# Patient Record
Sex: Female | Born: 1960 | Race: White | Hispanic: No | Marital: Married | Smoking: Never smoker
Health system: Southern US, Community
[De-identification: ages and names within clinical notes are randomized; demographics above are authoritative.]

## PROBLEM LIST (undated history)

## (undated) DIAGNOSIS — F32A Depression, unspecified: Secondary | ICD-10-CM

## (undated) DIAGNOSIS — M2550 Pain in unspecified joint: Secondary | ICD-10-CM

## (undated) DIAGNOSIS — G629 Polyneuropathy, unspecified: Secondary | ICD-10-CM

## (undated) DIAGNOSIS — F419 Anxiety disorder, unspecified: Secondary | ICD-10-CM

## (undated) DIAGNOSIS — K589 Irritable bowel syndrome without diarrhea: Secondary | ICD-10-CM

## (undated) DIAGNOSIS — M5410 Radiculopathy, site unspecified: Secondary | ICD-10-CM

## (undated) DIAGNOSIS — K754 Autoimmune hepatitis: Secondary | ICD-10-CM

## (undated) DIAGNOSIS — K760 Fatty (change of) liver, not elsewhere classified: Secondary | ICD-10-CM

## (undated) DIAGNOSIS — G8929 Other chronic pain: Secondary | ICD-10-CM

## (undated) DIAGNOSIS — G5139 Clonic hemifacial spasm, unspecified: Principal | ICD-10-CM

## (undated) DIAGNOSIS — M797 Fibromyalgia: Secondary | ICD-10-CM

## (undated) DIAGNOSIS — IMO0001 Reserved for inherently not codable concepts without codable children: Secondary | ICD-10-CM

## (undated) DIAGNOSIS — M329 Systemic lupus erythematosus, unspecified: Secondary | ICD-10-CM

## (undated) DIAGNOSIS — L4050 Arthropathic psoriasis, unspecified: Secondary | ICD-10-CM

## (undated) DIAGNOSIS — E7800 Pure hypercholesterolemia, unspecified: Secondary | ICD-10-CM

## (undated) DIAGNOSIS — M549 Dorsalgia, unspecified: Secondary | ICD-10-CM

## (undated) DIAGNOSIS — N390 Urinary tract infection, site not specified: Secondary | ICD-10-CM

## (undated) DIAGNOSIS — I10 Essential (primary) hypertension: Secondary | ICD-10-CM

## (undated) DIAGNOSIS — G4733 Obstructive sleep apnea (adult) (pediatric): Secondary | ICD-10-CM

## (undated) HISTORY — DX: Urinary tract infection, site not specified: N39.0

## (undated) HISTORY — PX: LUMBAR SPINE SURGERY: SHX701

## (undated) HISTORY — DX: Reserved for inherently not codable concepts without codable children: IMO0001

## (undated) HISTORY — DX: Anxiety disorder, unspecified: F41.9

## (undated) HISTORY — DX: Systemic lupus erythematosus, unspecified: M32.9

## (undated) HISTORY — DX: Fatty (change of) liver, not elsewhere classified: K76.0

## (undated) HISTORY — DX: Pure hypercholesterolemia, unspecified: E78.00

## (undated) HISTORY — DX: Essential (primary) hypertension: I10

## (undated) HISTORY — DX: Irritable bowel syndrome, unspecified: K58.9

## (undated) HISTORY — DX: Autoimmune hepatitis: K75.4

## (undated) HISTORY — DX: Other chronic pain: G89.29

## (undated) HISTORY — PX: MANDIBLE SURGERY: SHX707

## (undated) HISTORY — DX: Clonic hemifacial spasm, unspecified: G51.39

## (undated) HISTORY — DX: Radiculopathy, site unspecified: M54.10

## (undated) HISTORY — DX: Pain in unspecified joint: M25.50

## (undated) HISTORY — DX: Polyneuropathy, unspecified: G62.9

## (undated) HISTORY — DX: Dorsalgia, unspecified: M54.9

## (undated) HISTORY — DX: Obstructive sleep apnea (adult) (pediatric): G47.33

## (undated) HISTORY — DX: Fibromyalgia: M79.7

## (undated) HISTORY — DX: Depression, unspecified: F32.A

## (undated) HISTORY — DX: Arthropathic psoriasis, unspecified: L40.50

---

## 1984-02-10 HISTORY — PX: DENTAL SURGERY: SHX609

## 2000-03-11 ENCOUNTER — Encounter: Payer: Self-pay | Admitting: Family Medicine

## 2000-03-11 ENCOUNTER — Encounter: Admission: RE | Admit: 2000-03-11 | Discharge: 2000-03-11 | Payer: Self-pay | Admitting: Family Medicine

## 2001-04-09 ENCOUNTER — Emergency Department (HOSPITAL_COMMUNITY): Admission: EM | Admit: 2001-04-09 | Discharge: 2001-04-09 | Payer: Self-pay | Admitting: Emergency Medicine

## 2001-04-09 ENCOUNTER — Encounter: Payer: Self-pay | Admitting: Emergency Medicine

## 2001-06-23 ENCOUNTER — Other Ambulatory Visit: Admission: RE | Admit: 2001-06-23 | Discharge: 2001-06-23 | Payer: Self-pay | Admitting: Obstetrics and Gynecology

## 2002-08-04 ENCOUNTER — Encounter: Payer: Self-pay | Admitting: Family Medicine

## 2002-08-04 ENCOUNTER — Ambulatory Visit (HOSPITAL_COMMUNITY): Admission: RE | Admit: 2002-08-04 | Discharge: 2002-08-04 | Payer: Self-pay | Admitting: Family Medicine

## 2004-02-10 HISTORY — PX: PARTIAL HYSTERECTOMY: SHX80

## 2004-07-17 ENCOUNTER — Ambulatory Visit (HOSPITAL_COMMUNITY): Admission: RE | Admit: 2004-07-17 | Discharge: 2004-07-17 | Payer: Self-pay | Admitting: Family Medicine

## 2004-07-31 ENCOUNTER — Other Ambulatory Visit: Admission: RE | Admit: 2004-07-31 | Discharge: 2004-07-31 | Payer: Self-pay | Admitting: Obstetrics and Gynecology

## 2004-09-03 ENCOUNTER — Inpatient Hospital Stay (HOSPITAL_COMMUNITY): Admission: RE | Admit: 2004-09-03 | Discharge: 2004-09-04 | Payer: Self-pay | Admitting: Obstetrics and Gynecology

## 2004-09-03 ENCOUNTER — Encounter (INDEPENDENT_AMBULATORY_CARE_PROVIDER_SITE_OTHER): Payer: Self-pay | Admitting: Specialist

## 2005-02-09 HISTORY — PX: CERVICAL SPINE SURGERY: SHX589

## 2005-05-07 ENCOUNTER — Encounter: Admission: RE | Admit: 2005-05-07 | Discharge: 2005-05-07 | Payer: Self-pay

## 2005-06-03 ENCOUNTER — Encounter: Admission: RE | Admit: 2005-06-03 | Discharge: 2005-06-03 | Payer: Self-pay

## 2006-05-26 ENCOUNTER — Ambulatory Visit (HOSPITAL_COMMUNITY): Admission: RE | Admit: 2006-05-26 | Discharge: 2006-05-26 | Payer: Self-pay | Admitting: Family Medicine

## 2006-06-03 ENCOUNTER — Encounter: Admission: RE | Admit: 2006-06-03 | Discharge: 2006-06-03 | Payer: Self-pay | Admitting: Family Medicine

## 2007-01-12 ENCOUNTER — Emergency Department (HOSPITAL_COMMUNITY): Admission: EM | Admit: 2007-01-12 | Discharge: 2007-01-12 | Payer: Self-pay | Admitting: Emergency Medicine

## 2008-02-27 ENCOUNTER — Ambulatory Visit (HOSPITAL_COMMUNITY): Admission: RE | Admit: 2008-02-27 | Discharge: 2008-02-27 | Payer: Self-pay | Admitting: Family Medicine

## 2008-05-14 IMAGING — MG MM DIGITAL SCREENING BILAT
4 series · 4 of 4 positions shown · non-contrast
Comparison: none

DG SCREEN MAMMOGRAM BILATERAL
Bilateral CC and MLO view(s) were taken.

DIGITAL SCREENING MAMMOGRAM WITH CAD:
There is a fibroglandular pattern.  A possible mass is noted in the left breast.  Spot compression 
views and possibly sonography are recommended for further evaluation.  In the right breast, no 
masses or malignant type calcifications are identified.  Compared with prior studies.

[R CC]
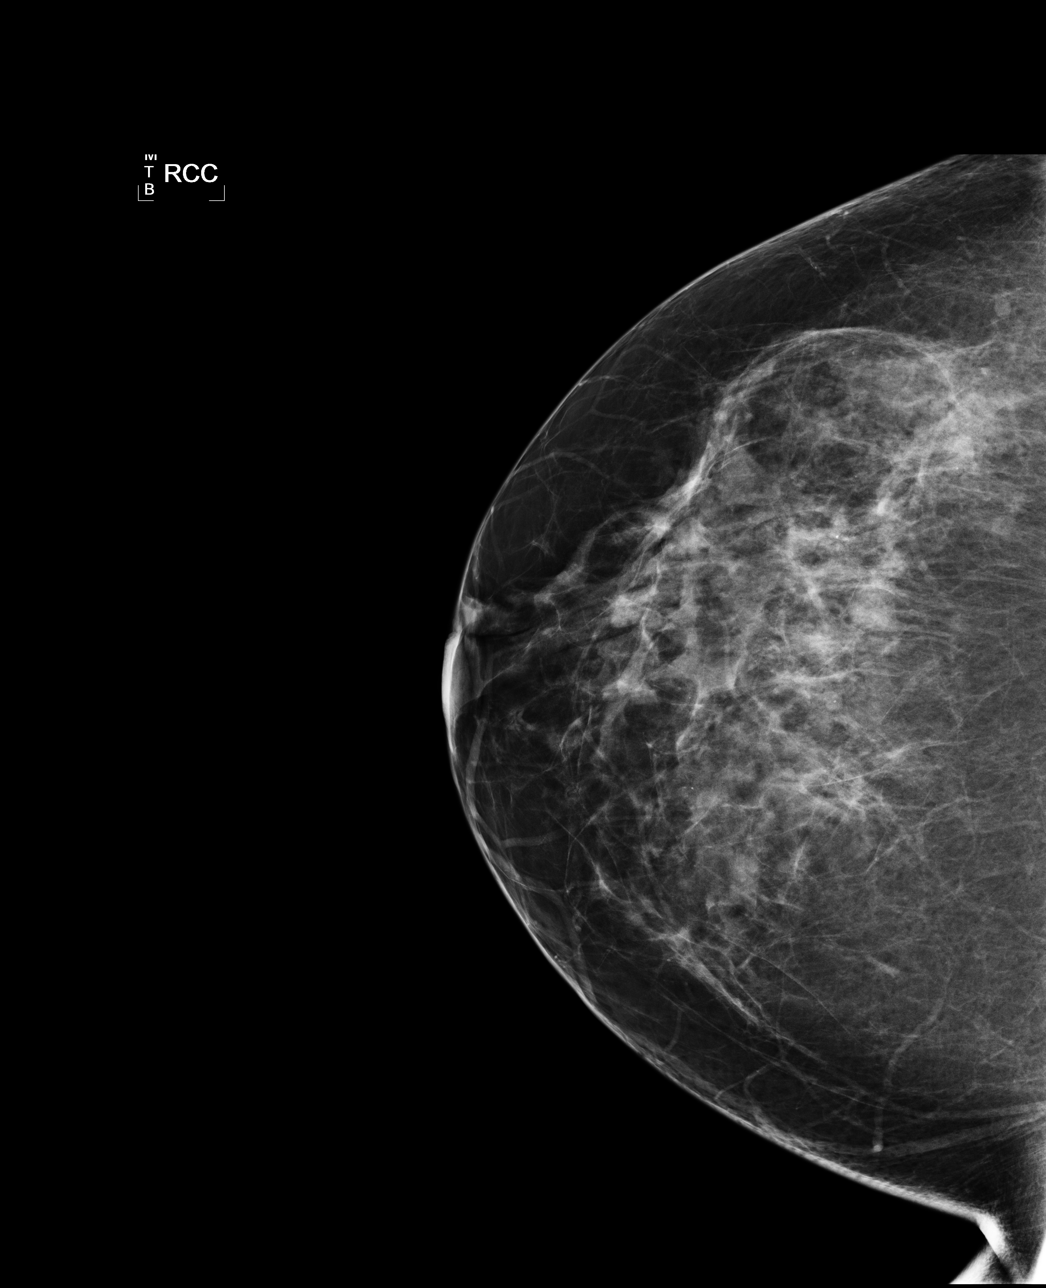

[R MLO]
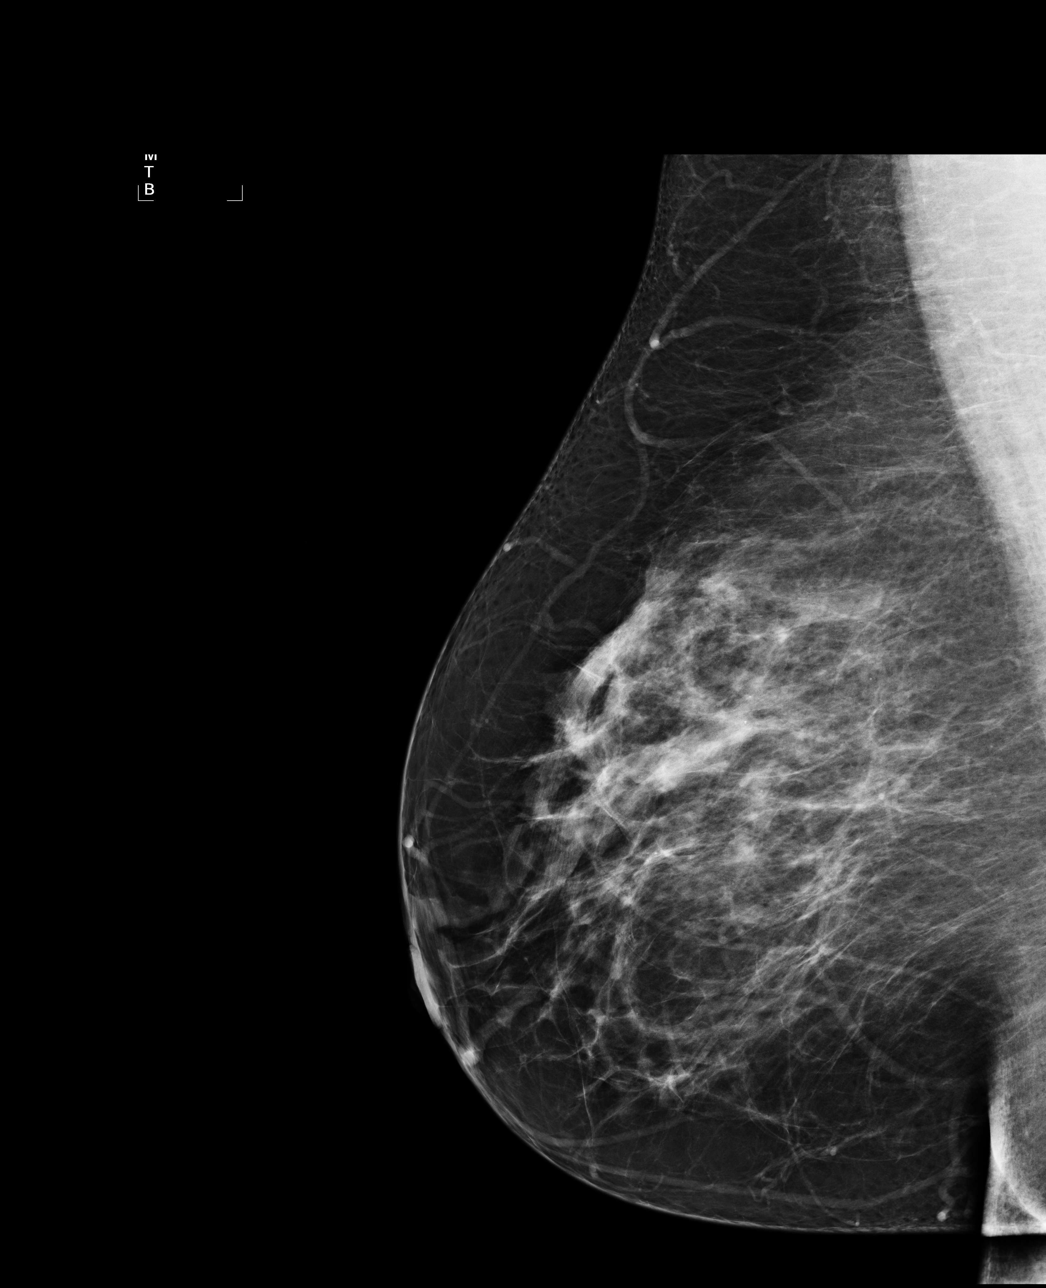

[L CC]
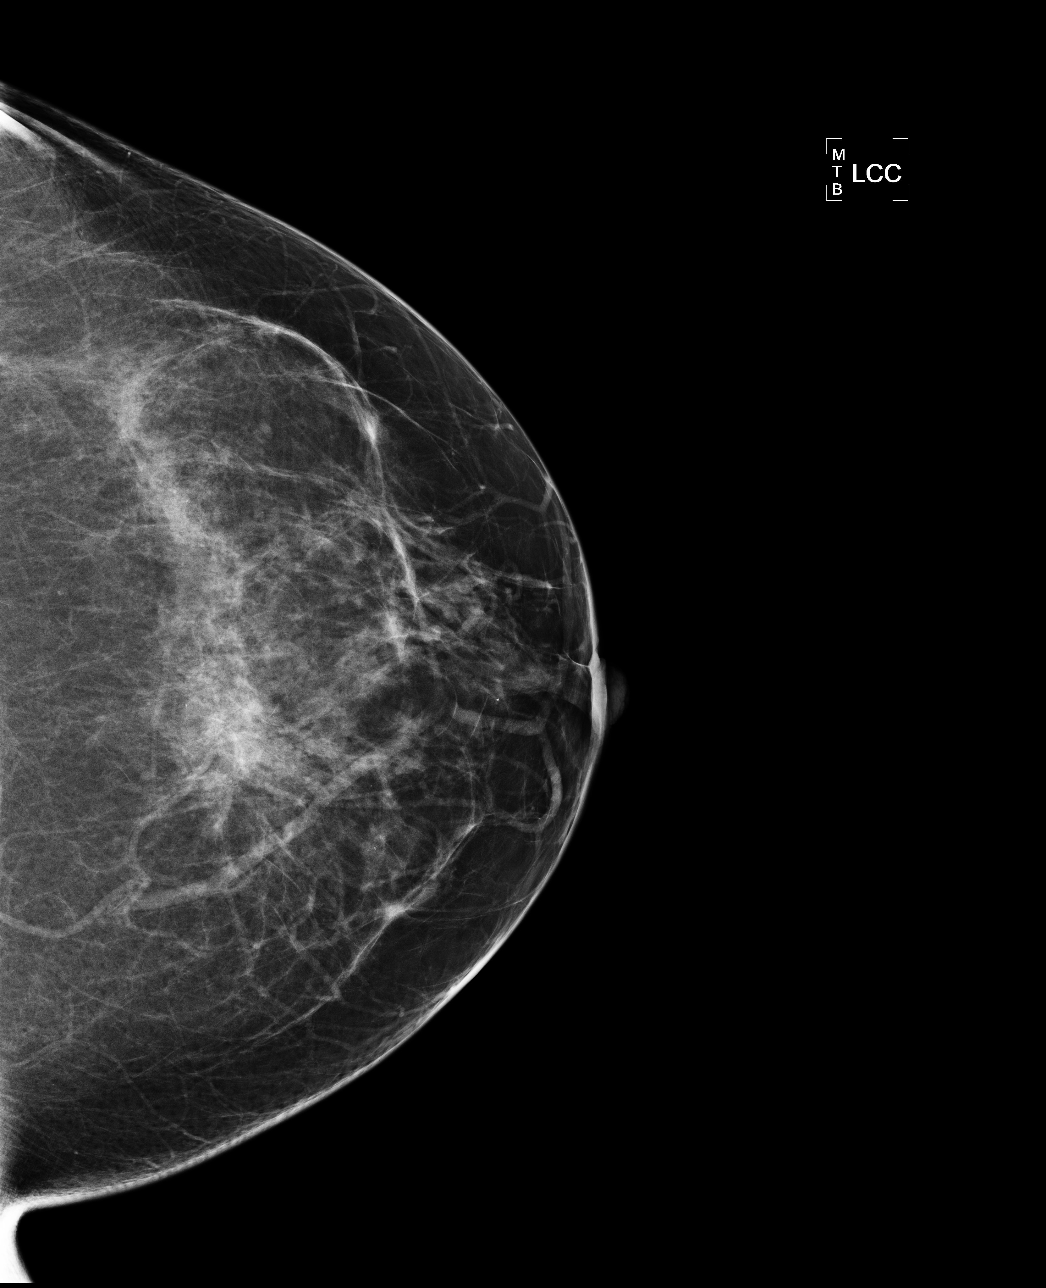

[L MLO]
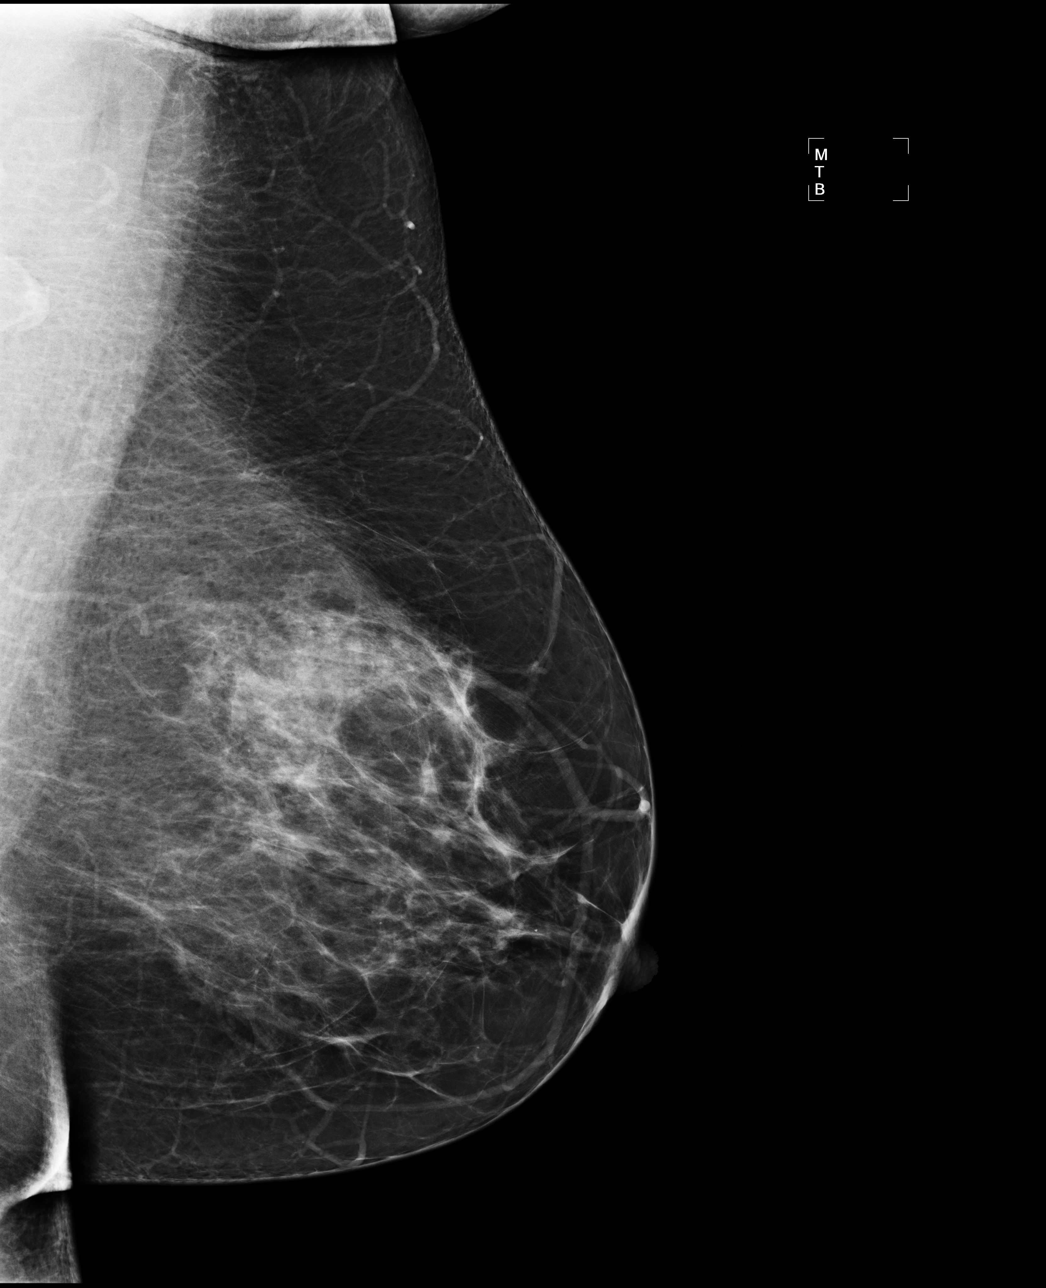

[4 of 4 positions shown; findings below may reference images not displayed]

IMPRESSION: Possible mass, left breast.  Additional evaluation is indicated.  The patient will be contacted for
additional studies and a supplementary report will follow.  No specific mammographic evidence of 
malignancy, right breast.

ASSESSMENT: Need additional imaging evaluation and/or prior mammograms for comparison - BI-RADS 0

Further imaging of the left breast.
ANALYZED BY COMPUTER AIDED DETECTION. , THIS PROCEDURE WAS A DIGITAL MAMMOGRAM.

## 2009-11-07 ENCOUNTER — Ambulatory Visit (HOSPITAL_COMMUNITY): Admission: RE | Admit: 2009-11-07 | Discharge: 2009-11-07 | Payer: Self-pay | Admitting: Family Medicine

## 2010-03-02 ENCOUNTER — Encounter: Payer: Self-pay | Admitting: Family Medicine

## 2010-06-27 NOTE — Op Note (Signed)
[name], [name]                  ACCOUNT NO.:  [id number]   MEDICAL RECORD NO.:  [id number]          PATIENT TYPE:  INP   LOCATION:  9313                          FACILITY:  [facility]   PHYSICIAN:  Randye Lobo, M.D.   DATE OF BIRTH:  [birth year]   DATE OF PROCEDURE:  09/03/2004  DATE OF DISCHARGE:                                 OPERATIVE REPORT   PREOPERATIVE DIAGNOSIS:  Menometrorrhagia   POSTOPERATIVE DIAGNOSIS:  Menometrorrhagia.   PROCEDURE:  Total abdominal hysterectomy.   SURGEON:  Randye Lobo, M.D.   ASSISTANT:  Ilda Mori, M.D.   ANESTHESIA:  Is spinal, epidural.   IV FLUIDS:  2500 mL Ringer's lactate.   ESTIMATED BLOOD LOSS:  300 mL   URINE OUTPUT:  100 mL   COMPLICATIONS:  None.   INDICATIONS FOR PROCEDURE:  The patient is a 50 year old para 70 Caucasian  female with a history of irregular of heavy menses who requests definitive  treatment for her bleeding problems and declines conservative therapy. The  patient has had a pelvic ultrasound which documented the absence of  fibroids. The myometrium having inhomogeneous appearance which was thought  to be consistent with adenomyosis. There was a small left paratubal cyst.  The patient had an endometrial biopsy performed in the office which was  benign and without evidence of hyperplasia or malignancy. The patient's  bleeding caused her to be anemic and she was requiring iron therapy to  maintain a normal red blood cell count. The patient has recently been placed  on disability due to her heavy and irregular bleeding. She has been  intermittently treated with medroxyprogesterone acetate, and she declined  any further conservative treatment of the bleeding and she declines future  childbearing.  A plan was made to proceed with a total abdominal  hysterectomy and possible bilateral salpingo-oophorectomy after risks,  benefits, and alternatives with discussed.   FINDINGS:  Exam under anesthesia revealed an 8  weeks' size, anteverted,  mobile uterus. No adnexal masses were appreciated.   At the time of laparotomy, the patient was noted to have an 8 week size  boggy and generally enlarged uterine fundus. There was a 2 cm left paratubal  cyst. Both of the ovaries were adherent to their respective pelvic  sidewalls. The fallopian tubes along with the ovaries were drawn against the  pelvic sidewalls. The infundibulopelvic ligaments had very engorged  appearing vessels. There was no evidence of any endometriosis in the pelvis.  The appendix had a normal appearance.   SPECIMENS:  The uterine fundus was removed separately from the cervix and  these were sent to pathology.   PROCEDURE:  The patient was reidentified in the preoperative hold area. She  did receive clindamycin 900 milligrams intravenously for antibiotic  prophylaxis. The patient received both TED hose and PAS stockings for DVT  prophylaxis.   In the operating room a spinal epidural anesthetic was performed. The  patient was then placed in the supine position and an examination under  anesthesia was performed. The findings were as noted above. The abdomen and  the  vagina were then sterilely prepped and a Foley catheter was placed  inside the bladder. She was then sterilely draped.   The procedure began by creating a Pfannenstiel incision with the scalpel  along the line of the patient's previous Pfannenstiel incisions. Incision  was carried down to the fascia with sharp dissection with the scalpel.  Hemostasis in the subcutaneous tissue was created with monopolar cautery.  The fascia was then incised in the midline with a scalpel and the incision  was extended bilaterally with a Mayo scissors. The rectus fascia was then  dissected off of the muscles sharply both superiorly and inferiorly. The  parietoperitoneum was elevated with two hemostat clamps and was entered  sharply. The rectus muscles were then sharply divided in the midline  and the  parietal peritoneal incision was extended cranially and caudally using sharp  dissection.   An examination of the pelvis was performed and the findings were as noted  above. A self-retaining retractor was then placed in the pelvis and the  bowel was packed into the upper abdomen using moistened lap pads.   Kelly clamps were then placed across the adnexal structures and the round  ligaments bilaterally. The right round ligament was then suture ligated with  a transfixing suture of 0 Vicryl. The round ligament was then divided with  monopolar cautery and the broad ligament was opened anteriorly and posterior  with a combination of monopolar cautery and sharp dissection with the  Metzenbaum scissors. The same procedure that was performed on the patient's  right-hand side was then repeated on the round ligament of the left-hand  side. Some adhesions between the ovary and the left pelvic sidewall were  then lysed using monopolar cautery and sharp dissection. This allowed  adequate mobility of the adnexal structures in order to clamp the utero-  ovarian ligament with a Heaney clamp. This was then sharply divided and the  pedicle was suture ligated with a transfixing suture of 0 Vicryl followed by  a free tie of 0 Vicryl. The bladder flap was then sharply created. Attention  was then addressed to the right adnexal region. The utero-ovarian ligament  in this region needed to be serially clamped, sharply divided, and suture  ligated with 0 Vicryl in order to divide the adnexa from the uterine fundus.  The uterine arteries were then skeletonized and the uterine arteries were  clamped and sharply divided and suture ligated with 0 Vicryl bilaterally.  The uterine fundus was next amputated from the cervix using a scalpel in  order to improve visualization and working space in the surgical field. The  specimen was set aside. The remainder of the cardinal ligaments were then clamped, sharply  divided with a scalpel, and suture ligated with either  simple suture ligatures of 0 Vicryl or transfixing sutures of 0 Vicryl. The  uterosacral ligaments were then clamped, sharply divided, and suture ligated  with transfixing sutures of 0 Vicryl bilaterally. Sharp dissection of the  bladder flap continued along the anterior cervix as the cervix was noted to  be quite long and thin. Ultimately the vagina was entered sharply, and a  Statinsky scissors were used to circumscribe the cervix from the surrounding  vagina and the cervix was sent to pathology along with the uterine fundus.  Angle sutures of 0 Vicryl were then created bilaterally and the vagina was  closed with a figure-of-eight suture of 0 Vicryl in the midline.   The pelvis was then irrigated and suctioned. An area  of lysis of adhesions  along the left pelvic sidewall and the sigmoid colon was seen to be oozing  slightly and a superficial running suture of  3-0 Vicryl was placed in the  peritoneum in this region. There was also a small area of bleeding along the  right parametrium which responded to a superficial figure-of-eight suture of  2-0 Vicryl. Hemostasis was then excellent.   The adnexal regions were next reassessed. Due to the engorged  infundibulopelvic ligament vessels and the adnexal adhesions which would  limit visualization and removal of the adnexal structures bilaterally, a  decision was made to leave the tubes and ovaries intact. There was some  bleeding noted along the serosa of the left fallopian tube which responded  well to monopolar cautery.   With good hemostasis at all the operative sites, decision was made to close  the abdomen at this time. The parietal peritoneum was closed with a running  suture of 2-0 Vicryl. The rectus muscles were reapproximated in the midline  with interrupted sutures of 0 Vicryl. The fascia was closed with a running  suture of 0 Vicryl after hemostasis of the rectus muscles was  created with  monopolar cautery. The subcutaneous tissue was then irrigated and suctioned  and made hemostatic with monopolar cautery. Staples were then used to close  the skin incision. The patient was cleansed of remaining Betadine and a  sterile pressure bandage was placed over this.   This concluded the patient's surgery. There were no complications to the  procedure. All needle, instrument, sponge counts were correct. The patient  is escorted to the recovery room in stable and awake condition.      Randye Lobo, M.D.  Electronically Signed     BES/MEDQ  D:  09/03/2004  T:  09/03/2004  Job:  [id number]

## 2010-12-04 ENCOUNTER — Other Ambulatory Visit (HOSPITAL_COMMUNITY): Payer: Self-pay | Admitting: Family Medicine

## 2010-12-04 DIAGNOSIS — Z1231 Encounter for screening mammogram for malignant neoplasm of breast: Secondary | ICD-10-CM

## 2011-01-06 ENCOUNTER — Ambulatory Visit (HOSPITAL_COMMUNITY)
Admission: RE | Admit: 2011-01-06 | Discharge: 2011-01-06 | Disposition: A | Discharge status: Home / Self Care | Payer: BC Managed Care – PPO | Source: Ambulatory Visit | Attending: Family Medicine | Admitting: Family Medicine

## 2011-01-06 DIAGNOSIS — Z1231 Encounter for screening mammogram for malignant neoplasm of breast: Secondary | ICD-10-CM | POA: Insufficient documentation

## 2011-01-21 ENCOUNTER — Other Ambulatory Visit: Payer: Self-pay | Admitting: Obstetrics and Gynecology

## 2011-12-24 ENCOUNTER — Other Ambulatory Visit (HOSPITAL_COMMUNITY): Payer: Self-pay | Admitting: Family Medicine

## 2011-12-24 DIAGNOSIS — Z1231 Encounter for screening mammogram for malignant neoplasm of breast: Secondary | ICD-10-CM

## 2012-01-14 ENCOUNTER — Ambulatory Visit (HOSPITAL_COMMUNITY)
Admission: RE | Admit: 2012-01-14 | Discharge: 2012-01-14 | Disposition: A | Discharge status: Home / Self Care | Payer: BC Managed Care – PPO | Source: Ambulatory Visit | Attending: Family Medicine | Admitting: Family Medicine

## 2012-01-14 DIAGNOSIS — Z1231 Encounter for screening mammogram for malignant neoplasm of breast: Secondary | ICD-10-CM | POA: Insufficient documentation

## 2012-08-08 ENCOUNTER — Ambulatory Visit: Payer: BC Managed Care – PPO | Attending: Internal Medicine | Admitting: Physical Therapy

## 2012-08-25 ENCOUNTER — Other Ambulatory Visit: Payer: Self-pay

## 2012-12-14 ENCOUNTER — Ambulatory Visit (INDEPENDENT_AMBULATORY_CARE_PROVIDER_SITE_OTHER): Payer: BC Managed Care – PPO | Admitting: Neurology

## 2012-12-14 ENCOUNTER — Encounter: Payer: Self-pay | Admitting: Neurology

## 2012-12-14 ENCOUNTER — Encounter (INDEPENDENT_AMBULATORY_CARE_PROVIDER_SITE_OTHER): Payer: Self-pay

## 2012-12-14 VITALS — BP 122/77 | HR 68 | Ht 62.0 in | Wt 208.0 lb

## 2012-12-14 DIAGNOSIS — G518 Other disorders of facial nerve: Secondary | ICD-10-CM

## 2012-12-14 DIAGNOSIS — G5139 Clonic hemifacial spasm, unspecified: Secondary | ICD-10-CM | POA: Insufficient documentation

## 2012-12-14 DIAGNOSIS — G4763 Sleep related bruxism: Secondary | ICD-10-CM

## 2012-12-14 DIAGNOSIS — G245 Blepharospasm: Secondary | ICD-10-CM

## 2012-12-14 DIAGNOSIS — M5410 Radiculopathy, site unspecified: Secondary | ICD-10-CM

## 2012-12-14 DIAGNOSIS — G4733 Obstructive sleep apnea (adult) (pediatric): Secondary | ICD-10-CM

## 2012-12-14 DIAGNOSIS — IMO0002 Reserved for concepts with insufficient information to code with codable children: Secondary | ICD-10-CM

## 2012-12-14 HISTORY — DX: Clonic hemifacial spasm, unspecified: G51.39

## 2012-12-14 NOTE — Patient Instructions (Signed)
CPAP and BIPAP Information CPAP and BIPAP are methods of helping you breathe with the use of air pressure. CPAP stands for "continuous positive airway pressure." BIPAP stands for "bi-level positive airway pressure." In both methods, air is blown into your air passages to help keep you breathing well. With CPAP, the amount of pressure stays the same while you breathe in and out. CPAP is most commonly used for obstructive sleep apnea. For obstructive sleep apnea, CPAP works by holding your airways open so that they do not collapse when your muscles relax during sleep. BIPAP is similar to CPAP except the amount of pressure is increased when you inhale. This helps you take larger breaths. Your health care provider will recommend whether CPAP or BIPAP would be more helpful for you.  WHY ARE CPAP AND BIPAP TREATMENTS USED? CPAP or BIPAP can be helpful if you have:   Sleep apnea.   Chronic obstructive pulmonary disease (COPD).   Diseases that weaken the muscles of the chest, including muscular dystrophy or neurological diseases such as amyotrophic lateral sclerosis (ALS).   Other problems that cause breathing to be weak, abnormal, or difficult.  HOW IS CPAP OR BIPAP ADMINISTERED? Both CPAP and BIPAP are provided by a small machine with a flexible plastic tube that attaches to a plastic mask. The mask fits on your face, and air is blown into your air passages through your nose or mouth. The amount of pressure that is used to blow the air into your air passages can be set on the machine. Your health care provider will determine the pressure setting that should be used based on your individual needs.  WHEN SHOULD CPAP OR BIPAP BE USED? In most cases, the mask is worn only when sleeping. Generally, you will need to wear the mask throughout the night and during the daytime if you take a nap. In a few cases involving certain medical conditions, people also need to wear the mask at other times when they are  awake. Follow your health care provider's instructions for when to use the machine.  USING THE MASK  Because the mask needs to be snug, some people feel a trapped or closed-in feeling (claustrophobic) when first using the mask. You may need to get used to the mask gradually. To do this, you can first hold the mask loosely over your nose or mouth. Gradually apply the mask more snugly. You can also gradually increase the amount of time that you use the mask.   Masks are available in various types and sizes. Some fit over your mouth and nose, and some fit over just your nose. If your mask does not fit well, talk to your health care provider about getting a different one.  If you are using a nasal mask and you tend to breathe through your mouth, a chin strap may be applied to help keep your mouth closed.   The CPAP and BIPAP machines have alarms that may sound if the mask comes off or develops a leak.   If you have trouble with the mask, it is very important that you talk to your health care provider about finding a way to make the mask easier to tolerate. Do not stop using the mask. This could have a negative impact on your health. TIPS FOR USING THE MACHINE  Place your CPAP or BIPAP machine on a secure table or stand near an electrical outlet.   Know where the on-off switch is located on the machine.     Follow your health care provider's instructions for how to set the pressure on your machine and when you should use it.   Do not eat or drink while the CPAP or BIPAP machine is on. Food or fluids could get pushed into your lungs by the pressure of the CPAP or BIPAP.  Do not smoke. Tobacco smoke residue can damage the machine.   For home use, CPAP and BIPAP machines can be rented or purchased through home health care companies. Many different brands of machines are available. Renting a machine before purchasing may help you find out which particular machine works well for you. SEEK  IMMEDIATE MEDICAL CARE IF:  You have redness or open areas around your nose or mouth where the mask fits.   You have trouble operating the CPAP or BIPAP machine.   You cannot tolerate wearing the CPAP or BIPAP mask.  Document Released: 10/25/2003 Document Revised: 09/28/2012 Document Reviewed: 08/25/2012 [facility] Patient Information 2014 [place], [place]. Blepharospasm Blepharospasm is an abnormal, involuntary blinking, movement, or spasm of the eyelids. CAUSES  Anyone can have an eye twitch from time to time. In most cases, there is no clear cause. Eyelid spasms may be associated with or prolonged by:   Alcohol.  Caffeine.  Fatigue.  Irritation of the eye surface or inner eyelids.  Lack of sleep.  Physical exertion.  Smoking.  Stress. Chronic, uncontrollable eyelid movement affecting both eyes is known as benign essential blepharospasm. This is not a common problem. Although its exact cause is unknown, the following conditions sometimes come before or during a problem of benign essential blepharospasm:   Eyelid infection (blepharitis).  Dry eyes.  Light sensitivity.  Pink eye (conjunctivitis). Rarely, eye twitch may be a sign of certain brain and nerve disorders. When it is, there are almost always other signs and symptoms. Brain and nerve disorders that can cause eye twitch include:   Bell's palsy.  Benign essential blepharospasm.  Dystonia.  Parkinson's Disease.  Side effects of drugs, particularly medications used to treat epilepsy and psychosis.  Spasmodic torticollis (a different type of muscle spasm sometimes accompanied by blepharospasm).  Tourette syndrome. SYMPTOMS  Random eye twitching can come and go for a few days, weeks or months. The spasms do not hurt, but they can be annoying. In its most common, harmless form, eye twitching stops on its own. It may recur off and on for no reason. The onset of blepharospasm may include:   The development of  blepharospasm without any warning signs.  A gradual increase in blinking or eye irritation.  Other symptoms including:  Fatigue.  Emotional tension.  Sensitivity to bright light. Symptoms may lessen or stop while:  A person is sleeping.  Concentrating on a specific task. If the condition progresses, the symptoms become more frequent. Facial spasms may develop. This is rare and may be the earliest sign of a more chronic movement disorder. TREATMENT  To date, there is no successful cure. Several treatment options can reduce its severity.  In some places, Botox shots into the muscles of the eyelids is an approved treatment. Botulinum toxin temporarily paralyzes the muscles of the eyelids.  Other medications can have unpredictable results. Any symptom relief is usually short term. They tend to be helpful in only a small percentage of cases.  A surgical procedure to remove some of the muscles and nerves of the eyelids (myectomy) is also an option. This surgery has improved symptoms in 75 to 85 percent of people.  Alternative treatments (  the benefits of these alternative therapies have not been proven) may include:  Biofeedback.  Acupuncture.  Hypnosis.  Chiropractic treatment.  Nutritional therapy. FOR MORE INFORMATION [employer]: [email or web address] Document Released: 01/29/2003 Document Revised: 04/20/2011 Document Reviewed: 12/14/2006 [facility] Patient Information 2014 [place], [place].

## 2012-12-14 NOTE — Progress Notes (Signed)
[facility]  Provider:  Melvyn Novas, M D  Referring Provider: Richmond Campbell., PA-C Primary Care Physician:  No primary provider on file.  Chief Complaint  Patient presents with  . Advice Only    LEG WEEKNESS AND NUMBNESS    HPI:  [name] is a 52 y.o. female  Is seen here as a yearly revisit  from  [place], [place]  for CPAP compliance.  The patient was referred today for a 2 problems as well, leg pain and numbness, bilaterally and Blepharospasm. Se did not bring her CPAP to the visit, but showed e her smart phone ap that measured motor activity. Most nights she gets 4-5 hours of rest , according to the activity tracker.  The patient presented on 07-16-2011 her Epworth was 14 points and she had just been pulled by her daughter during a vacation that she stop breathing at night and snored loudly. Polysomnography to place interpreted thousand 13 and was split but CPAP trigger central  apnea is. She was finally responding to BiPAP at 16 cm/ to 10 cm water in combination with heated humidity and a nasal pillows .  The residual AHI on the last download was 5.8 and later 2.8. She is followed by  [facility] .  She had no longer trouble with nocturnal arousals sleep fragmentation,  nocturia or insomnia after starting on BiPAP. Her Epworth sleepiness score was 1 point in June 2013 [name] is been a patient of mine in the past I have followed her for obstructive sleep apnea and BiPAP compliance, next scheduled for a December  visit 214.     She has a past medical history of lupus erythema jaundice, chronic pain and allergies she also had multiple level discectomies and cervical multi level  fusion surgery in the past.  Hypertension, depression and anxiety for also endorsed. She feels sleep is not longer a problem.     She states today that she has had is occasional bouts with blepharospasms the first time when she was in her early twenties, now reported that the  spells happen multiple times a day and sometimes  hinder her for example to drive. She feels that her face is " crunching up " ,  is drawn and the blepharospasm is difficult to overcome.  She also reports that when she is self-conscious or under stress, these blepharospasm  spells were ocurring  more frequently. There is no associated vocalization, she has no urge to  lick her lower lips or clear her throat.  The patient also reports lower extremity pain starting in the hips and running down her legs into the dorsum pedis and between there large and second toe. The Zocor bilaterally. They started in the spring time after thousand 14 after she had been housecleaning she was cleaning a bathtub and seemed to have over stretched something with this activity. The character of the pain is sharp, stabbing. It is no electroshock sensation. He may the pain became suddenly bad,  she had to walk with a cane. Her primary care physician ordered a knee x-ray. She had to miss a whole triathlon- season.  A second bad episode happened in September 2014 - she was impaired in her ability to ambulate and sometimes felt to roll was better to crawl  than to to walk.  The rheumatologist had already ruled out that this was a symptom related to lupus-  he had mentioned fibromyalgia,  Which neither the character nor distribution  of pain  Fit- she does not have baseline myalgia, no trigerpoints.     Review of Systems: Out of a complete 14 system review, the patient complains of only the following symptoms, and all other reviewed systems are negative. As above, joint pain, lower extremity pain in a strip-like distribution, fatigue and weight loss.  History   Social History  . Marital Status: Married    Spouse Name: N/A    Number of Children: 3  . Years of Education: college   Occupational History  .      clinic worker   Social History Main Topics  . Smoking status: Never Smoker   . Smokeless tobacco: Never Used  .  Alcohol Use: No  . Drug Use: No  . Sexual Activity: Not on file   Other Topics Concern  . 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 Diabetes Mother     CHF  . Heart failure Father     HEART ATTACK  . Cancer - Other Maternal Grandmother     Past Medical History  Diagnosis Date  . Hypertension   . Neuropathy   . Joint pain   . Myalgia and myositis     Past Surgical History  Procedure Laterality Date  . Cesarean section  [id number]  . Partial hysterectomy  2006  . Dental surgery  1986    wisdom tooth  . Cervical spine surgery  2007    Current Outpatient Prescriptions  Medication Sig Dispense Refill  . buPROPion (WELLBUTRIN SR) 150 MG 12 hr tablet       . citalopram (CELEXA) 10 MG tablet       . cyclobenzaprine (FLEXERIL) 5 MG tablet       . gabapentin (NEURONTIN) 300 MG capsule       . hydrochlorothiazide (HYDRODIURIL) 25 MG tablet       . HYDROcodone-acetaminophen (NORCO) 10-325 MG per tablet       . hydroxychloroquine (PLAQUENIL) 200 MG tablet       . nitrofurantoin (MACRODANTIN) 50 MG capsule       . predniSONE (DELTASONE) 5 MG tablet       . zolpidem (AMBIEN) 10 MG tablet       . lisinopril (PRINIVIL,ZESTRIL) 10 MG tablet [name] PA , [facility]       No current facility-administered medications for this visit.    Allergies as of 12/14/2012 - Review Complete 12/14/2012  Allergen Reaction Noted  . Fentanyl  12/14/2012  . Penicillins  12/14/2012    Vitals: BP 122/77  Pulse 68  Ht 5\' 2"  (1.575 m)  Wt 208 lb (94.348 kg)  BMI 38.03 kg/m2 Last Weight:  Wt Readings from Last 1 Encounters:  12/14/12 208 lb (94.348 kg)   Last Height:   Ht Readings from Last 1 Encounters:  12/14/12 5\' 2"  (1.575 m)    Physical exam:  General: The patient is awake, alert and appears not in acute distress. The  patient is well groomed. Head: Normocephalic, atraumatic. Neck is supple. Mallampati 2,  Retrognathia, neck circumference:14.5  Cardiovascular:  Regular rate and rhythm , without  murmurs or carotid bruit, and without distended neck veins. Respiratory: Lungs are clear to auscultation. Skin:  Without evidence of edema, or rash Trunk: BMI is elevated and patient  has  normal posture.  Neurologic exam : The patient is awake and alert, oriented to place and time.  Memory subjective  described as intact. There is a normal attention span & concentration ability.  Speech is fluent without dysarthria, dysphonia or aphasia. Mood and affect are appropriate.  Cranial nerves: Pupils are equal and briskly reactive to light. Funduscopic exam without  evidence of pallor or edema.  Extraocular movements  in vertical and horizontal planes intact and without nystagmus. Visual fields by finger perimetry are intact. Hearing to finger rub intact.  Facial sensation intact to fine touch. Facial motor strength is symmetric and tongue and uvula move midline.  Motor exam:   Normal tone and normal muscle bulk and symmetric normal strength in all extremities.  Sensory:  Fine touch, pinprick and vibration were tested in all extremities. Proprioception is normal.  Left foot has more pain, described as tender to the touch , middle of the dorsum pedis.   Coordination: Rapid alternating movements in the fingers/hands is tested and normal. Finger-to-nose maneuver tested and normal without evidence of ataxia, dysmetria or tremor.  Gait and station: Patient walks without assistive device. Strength within normal limits. Stance is stable and normal. Tandem gait is fragmented- she is at higher fall risk. Romberg testing is negative . She can not toe-stand on the left foot.   Deep tendon reflexes: in the  Upper extremities are symmetric and intact. Lower extremities : The left patella reflex was much more brisk than the right.  Babinski maneuver response  Downgoing. The right achilles tendon reflex is absent.    Assessment:  After physical and neurologic examination and pre-existing records, assessment is   1) facial spasms, not only blepharospasms, this is not  Gilles de la Tourettes Syndrome , but a possible tc versus movement disorder. Refer to Dr. Terrace Arabia, Suzzette Righter for movement - possible treatment with  Botox.  2) OSA follow up could not be performed , no machine was provided.  [facility] . 3) Radicular pain L5, S1 - EMG and NCS ordered. MRI lower spine. Dr Ethelene Hal treats her for pain, No [facility] prescriptions other than CPAP supplies were issued.   Plan:  Treatment plan and additional workup : See above .

## 2012-12-16 ENCOUNTER — Encounter: Payer: Self-pay | Admitting: Neurology

## 2012-12-22 NOTE — Telephone Encounter (Signed)
Dear Mrs. Carolin Coy.   In general , patients like to apply their tests to their deductible as to reduce costs. Since the health care insurance plans have varied greatly I am not able to give you an answer as to your plan.  Medically , I would like for you to get the test within 6-8 weeks , early January would be fine.  If your studies count towards this expense or not can be answered by our pre- authorization experts.  Forwarding your request to [facility].   Olie Scaffidi, MD

## 2012-12-23 ENCOUNTER — Telehealth: Payer: Self-pay | Admitting: Neurology

## 2012-12-26 ENCOUNTER — Encounter: Payer: BC Managed Care – PPO | Admitting: Radiology

## 2012-12-26 ENCOUNTER — Encounter: Payer: BC Managed Care – PPO | Admitting: Neurology

## 2012-12-28 ENCOUNTER — Institutional Professional Consult (permissible substitution): Payer: BC Managed Care – PPO | Admitting: Neurology

## 2013-01-03 NOTE — Telephone Encounter (Signed)
Please advise 

## 2013-01-18 ENCOUNTER — Ambulatory Visit: Payer: Self-pay | Admitting: Neurology

## 2013-02-16 ENCOUNTER — Ambulatory Visit (INDEPENDENT_AMBULATORY_CARE_PROVIDER_SITE_OTHER): Payer: BC Managed Care – PPO

## 2013-02-16 DIAGNOSIS — IMO0002 Reserved for concepts with insufficient information to code with codable children: Secondary | ICD-10-CM

## 2013-02-16 DIAGNOSIS — M5410 Radiculopathy, site unspecified: Secondary | ICD-10-CM

## 2013-02-17 ENCOUNTER — Telehealth: Payer: Self-pay | Admitting: Neurology

## 2013-02-17 NOTE — Telephone Encounter (Signed)
I called patient. The MRI of the lumbosacral spine appears to be relatively unremarkable, mild arthritic changes, no evidence of nerve root impingement. I discussed this with the patient. The patient will be having an EMG study soon.  IMPRESSION: Abnormal MRI scan of the lumbar spine showing mild disc and facet degenerative changes from L2-L5 but without significant canal, foraminal stenosis or root impingement.

## 2013-02-22 ENCOUNTER — Telehealth: Payer: Self-pay

## 2013-02-22 NOTE — Telephone Encounter (Signed)
Message copied by [facility], Pearlean Sabina on Wed Feb 22, 2013 10:49 AM ------      Message from: Vickey HugerHMEIER, CARMEN      Created: Mon Feb 20, 2013  1:57 PM       Multilevel degenerative spine disease , but no impingement. Best treatment would be PT and muscle relaxants. ------

## 2013-02-22 NOTE — Telephone Encounter (Signed)
I called patient and we discussed the MRI findings and recommendations. Patient has an EMG/NCV here next week. She will consider the PT and will discuss further next week.

## 2013-02-27 ENCOUNTER — Ambulatory Visit (INDEPENDENT_AMBULATORY_CARE_PROVIDER_SITE_OTHER): Payer: BC Managed Care – PPO | Admitting: Neurology

## 2013-02-27 ENCOUNTER — Encounter (INDEPENDENT_AMBULATORY_CARE_PROVIDER_SITE_OTHER): Payer: Self-pay

## 2013-02-27 ENCOUNTER — Telehealth: Payer: Self-pay | Admitting: Neurology

## 2013-02-27 DIAGNOSIS — M25562 Pain in left knee: Secondary | ICD-10-CM | POA: Insufficient documentation

## 2013-02-27 DIAGNOSIS — M79609 Pain in unspecified limb: Secondary | ICD-10-CM

## 2013-02-27 DIAGNOSIS — M79605 Pain in left leg: Secondary | ICD-10-CM

## 2013-02-27 DIAGNOSIS — M5410 Radiculopathy, site unspecified: Secondary | ICD-10-CM

## 2013-02-27 NOTE — Procedures (Signed)
    [facility]  NCS (NERVE CONDUCTION STUDY) WITH EMG (ELECTROMYOGRAPHY) REPORT   STUDY DATE: 02/27/2013 PATIENT NAME: Pennelope BrackenMary S West DOB: 07/18/1960 MRN: [id number]    TECHNOLOGIST: Gearldine ShownLorraine Jones ELECTROMYOGRAPHER: Levert FeinsteinYan, Nahjae Hoeg M.D.  CLINICAL INFORMATION:  53 years old Caucasian female, presenting with acute onset of left lateral leg achy pain, difficulty walking, since April 2013. On exam: Bilateral lower extremity motor and sensory examination were normal. Deep tendon reflexes are present and symmetric.   FINDINGS: NERVE CONDUCTION STUDY:  Bilateral peroneal sensory responses were normal. Bilateral peroneal to EDB and tibial motor responses were normal  NEEDLE ELECTROMYOGRAPHY:  Selected needle examination was performed at left extremity muscles, and left lumbosacral paraspinal muscles.  Needle examination of left tibialis anterior, tibialis posterior, peroneal longus, medial gastrocnemius, vastus lateralis, gluteus medius were normal.  There is no spontaneous activity at the left lumbosacral paraspinal muscles, left L4 L5, S1  IMPRESSION:  This is a normal study. There is no electrodiagnostic evidence of large fiber peripheral neuropathy, or left lumbosacral radiculopathy.   INTERPRETING PHYSICIAN:   Levert FeinsteinYan, Jakavion Bilodeau M.D. Ph.D. [facility] [address], [address] [place], [place] [address] [phone]

## 2013-02-27 NOTE — Telephone Encounter (Signed)
Please advise 

## 2013-02-27 NOTE — Telephone Encounter (Signed)
HAD NEGATIVE NCV/EMG RESULTS TODAY--WANTS TO KNOW WHAT TO DO NEXT.

## 2013-02-28 NOTE — Telephone Encounter (Signed)
She has pain in her left leg and foot, no weakness, no back pain, no rash and no CN findings. MRi lumbar spine , and EMG and NCS were normal.  Pt ordered for left foot and leg, no dystonia.

## 2013-03-01 NOTE — Telephone Encounter (Signed)
Pt returned call. Please call back.

## 2013-03-01 NOTE — Telephone Encounter (Signed)
Called patient to inform of referral for P-Therapy had been ordered per Dr Vickey Hugerohmeier, left message for return call

## 2013-03-02 NOTE — Telephone Encounter (Signed)
Called patient and informed of PT order sent to [facility], she verbalized understanding

## 2013-03-20 ENCOUNTER — Telehealth: Payer: Self-pay | Admitting: Neurology

## 2013-03-20 NOTE — Telephone Encounter (Signed)
Patient calling concerning referral for physical therapy. Please advise.

## 2013-03-20 NOTE — Telephone Encounter (Signed)
F/U on referral for physical therapy

## 2013-05-18 NOTE — Progress Notes (Signed)
Quick Note:  Patient was called with results of MRI lumbar spine by Dr. Anne HahnWillis in Jan. 2015. ______

## 2014-01-09 ENCOUNTER — Other Ambulatory Visit: Payer: Self-pay

## 2014-03-29 ENCOUNTER — Other Ambulatory Visit (HOSPITAL_COMMUNITY): Payer: Self-pay | Admitting: Family Medicine

## 2014-03-29 DIAGNOSIS — Z1231 Encounter for screening mammogram for malignant neoplasm of breast: Secondary | ICD-10-CM

## 2014-04-03 ENCOUNTER — Ambulatory Visit (HOSPITAL_COMMUNITY): Payer: Self-pay

## 2014-04-12 ENCOUNTER — Ambulatory Visit (HOSPITAL_COMMUNITY): Payer: Self-pay

## 2014-05-03 ENCOUNTER — Ambulatory Visit (HOSPITAL_COMMUNITY)
Admission: RE | Admit: 2014-05-03 | Discharge: 2014-05-03 | Disposition: A | Discharge status: Home / Self Care | Payer: BLUE CROSS/BLUE SHIELD | Source: Ambulatory Visit | Attending: Family Medicine | Admitting: Family Medicine

## 2014-05-03 DIAGNOSIS — Z1231 Encounter for screening mammogram for malignant neoplasm of breast: Secondary | ICD-10-CM | POA: Insufficient documentation

## 2014-07-11 DIAGNOSIS — L4050 Arthropathic psoriasis, unspecified: Secondary | ICD-10-CM | POA: Insufficient documentation

## 2014-07-11 DIAGNOSIS — E785 Hyperlipidemia, unspecified: Secondary | ICD-10-CM | POA: Insufficient documentation

## 2014-07-11 DIAGNOSIS — M791 Myalgia, unspecified site: Secondary | ICD-10-CM | POA: Insufficient documentation

## 2014-07-11 DIAGNOSIS — G4700 Insomnia, unspecified: Secondary | ICD-10-CM | POA: Insufficient documentation

## 2014-07-11 DIAGNOSIS — R29898 Other symptoms and signs involving the musculoskeletal system: Secondary | ICD-10-CM | POA: Insufficient documentation

## 2014-07-11 DIAGNOSIS — I10 Essential (primary) hypertension: Secondary | ICD-10-CM | POA: Insufficient documentation

## 2014-07-11 DIAGNOSIS — M329 Systemic lupus erythematosus, unspecified: Secondary | ICD-10-CM | POA: Insufficient documentation

## 2014-07-11 DIAGNOSIS — F32A Depression, unspecified: Secondary | ICD-10-CM | POA: Insufficient documentation

## 2014-12-11 DIAGNOSIS — H25011 Cortical age-related cataract, right eye: Secondary | ICD-10-CM | POA: Insufficient documentation

## 2014-12-11 HISTORY — DX: Cortical age-related cataract, right eye: H25.011

## 2014-12-25 DIAGNOSIS — H2512 Age-related nuclear cataract, left eye: Secondary | ICD-10-CM

## 2014-12-25 HISTORY — DX: Age-related nuclear cataract, left eye: H25.12

## 2015-10-25 DIAGNOSIS — H5213 Myopia, bilateral: Secondary | ICD-10-CM | POA: Insufficient documentation

## 2015-10-25 DIAGNOSIS — H43811 Vitreous degeneration, right eye: Secondary | ICD-10-CM

## 2015-10-25 HISTORY — DX: Vitreous degeneration, right eye: H43.811

## 2015-11-28 ENCOUNTER — Other Ambulatory Visit: Payer: Self-pay | Admitting: Family Medicine

## 2015-11-28 DIAGNOSIS — Z1231 Encounter for screening mammogram for malignant neoplasm of breast: Secondary | ICD-10-CM

## 2015-12-16 ENCOUNTER — Ambulatory Visit
Admission: RE | Admit: 2015-12-16 | Discharge: 2015-12-16 | Disposition: A | Discharge status: Home / Self Care | Payer: 59 | Source: Ambulatory Visit | Attending: Family Medicine | Admitting: Family Medicine

## 2015-12-16 DIAGNOSIS — Z1231 Encounter for screening mammogram for malignant neoplasm of breast: Secondary | ICD-10-CM

## 2016-03-18 ENCOUNTER — Other Ambulatory Visit: Payer: Self-pay | Admitting: Internal Medicine

## 2016-03-18 DIAGNOSIS — R945 Abnormal results of liver function studies: Principal | ICD-10-CM

## 2016-03-18 DIAGNOSIS — R7989 Other specified abnormal findings of blood chemistry: Secondary | ICD-10-CM

## 2016-03-23 ENCOUNTER — Ambulatory Visit
Admission: RE | Admit: 2016-03-23 | Discharge: 2016-03-23 | Disposition: A | Discharge status: Home / Self Care | Payer: 59 | Source: Ambulatory Visit | Attending: Internal Medicine | Admitting: Internal Medicine

## 2016-03-23 DIAGNOSIS — R945 Abnormal results of liver function studies: Principal | ICD-10-CM

## 2016-03-23 DIAGNOSIS — R7989 Other specified abnormal findings of blood chemistry: Secondary | ICD-10-CM

## 2016-03-25 ENCOUNTER — Other Ambulatory Visit (HOSPITAL_COMMUNITY): Payer: Self-pay | Admitting: Gastroenterology

## 2016-03-25 DIAGNOSIS — R748 Abnormal levels of other serum enzymes: Secondary | ICD-10-CM

## 2016-04-01 ENCOUNTER — Other Ambulatory Visit: Payer: Self-pay | Admitting: Radiology

## 2016-04-01 ENCOUNTER — Other Ambulatory Visit: Payer: Self-pay | Admitting: Physician Assistant

## 2016-04-02 ENCOUNTER — Encounter (HOSPITAL_COMMUNITY): Payer: Self-pay

## 2016-04-02 ENCOUNTER — Ambulatory Visit (HOSPITAL_COMMUNITY)
Admission: RE | Admit: 2016-04-02 | Discharge: 2016-04-02 | Disposition: A | Discharge status: Home / Self Care | Payer: 59 | Source: Ambulatory Visit | Attending: Gastroenterology | Admitting: Gastroenterology

## 2016-04-02 DIAGNOSIS — I10 Essential (primary) hypertension: Secondary | ICD-10-CM | POA: Insufficient documentation

## 2016-04-02 DIAGNOSIS — Z8249 Family history of ischemic heart disease and other diseases of the circulatory system: Secondary | ICD-10-CM | POA: Diagnosis not present

## 2016-04-02 DIAGNOSIS — M329 Systemic lupus erythematosus, unspecified: Secondary | ICD-10-CM | POA: Diagnosis not present

## 2016-04-02 DIAGNOSIS — R768 Other specified abnormal immunological findings in serum: Secondary | ICD-10-CM | POA: Insufficient documentation

## 2016-04-02 DIAGNOSIS — R748 Abnormal levels of other serum enzymes: Secondary | ICD-10-CM | POA: Diagnosis present

## 2016-04-02 DIAGNOSIS — Z79899 Other long term (current) drug therapy: Secondary | ICD-10-CM | POA: Diagnosis not present

## 2016-04-02 DIAGNOSIS — G629 Polyneuropathy, unspecified: Secondary | ICD-10-CM | POA: Insufficient documentation

## 2016-04-02 DIAGNOSIS — K759 Inflammatory liver disease, unspecified: Secondary | ICD-10-CM | POA: Diagnosis not present

## 2016-04-02 DIAGNOSIS — Z7982 Long term (current) use of aspirin: Secondary | ICD-10-CM | POA: Insufficient documentation

## 2016-04-02 DIAGNOSIS — Z833 Family history of diabetes mellitus: Secondary | ICD-10-CM | POA: Diagnosis not present

## 2016-04-02 DIAGNOSIS — Z880 Allergy status to penicillin: Secondary | ICD-10-CM | POA: Diagnosis not present

## 2016-04-02 LAB — APTT: aPTT: 34 seconds (ref 24–36)

## 2016-04-02 LAB — CBC
HCT: 40.2 % (ref 36.0–46.0)
Hemoglobin: 13.4 g/dL (ref 12.0–15.0)
MCH: 30.9 pg (ref 26.0–34.0)
MCHC: 33.3 g/dL (ref 30.0–36.0)
MCV: 92.6 fL (ref 78.0–100.0)
PLATELETS: 252 10*3/uL (ref 150–400)
RBC: 4.34 MIL/uL (ref 3.87–5.11)
RDW: 13.8 % (ref 11.5–15.5)
WBC: 5.4 10*3/uL (ref 4.0–10.5)

## 2016-04-02 LAB — PROTIME-INR
INR: 1.04
PROTHROMBIN TIME: 13.6 s (ref 11.4–15.2)

## 2016-04-02 MED ORDER — GELATIN ABSORBABLE 12-7 MM EX MISC
CUTANEOUS | Status: AC
  Filled 2016-04-02: qty 1

## 2016-04-02 MED ORDER — LIDOCAINE HCL (PF) 1 % IJ SOLN
INTRAMUSCULAR | Status: AC
  Filled 2016-04-02: qty 10

## 2016-04-02 MED ORDER — MIDAZOLAM HCL 2 MG/2ML IJ SOLN
INTRAMUSCULAR | Status: AC | PRN
  Administered 2016-04-02 (×2): 1 mg via INTRAVENOUS

## 2016-04-02 MED ORDER — SODIUM CHLORIDE 0.9 % IV SOLN: INTRAVENOUS | Status: DC

## 2016-04-02 MED ORDER — MIDAZOLAM HCL 2 MG/2ML IJ SOLN
INTRAMUSCULAR | Status: AC
  Filled 2016-04-02: qty 4

## 2016-04-02 MED ORDER — SODIUM CHLORIDE 0.9 % IV SOLN
INTRAVENOUS | Status: AC | PRN
  Administered 2016-04-02: 10 mL/h via INTRAVENOUS

## 2016-04-02 NOTE — Sedation Documentation (Signed)
Called to give report. Nurse unavailable. Will call back 

## 2016-04-02 NOTE — H&P (Signed)
Chief Complaint: Patient was seen in consultation today for random liver biopsy at the request of Schooler,Vincent  Referring Physician(s): Schooler,Vincent  Supervising Physician: Ruel Favors  Patient Status: [facility] - Out-pt  History of Present Illness: [name] is a 56 y.o. female   Dx Lupus over 10 yrs ago Was just treated with Methotrexate x 1.5 yrs  Noted new increase in Liver function tests  [place] 2/12: IMPRESSION: 1.  Multiple gallstones. 2. Liver has a heterogeneous echotexture suggesting fatty infiltration and/or hepatocellular disease.  Dr Bosie Clos has requested liver core biopsy    Past Medical History:  Diagnosis Date  . 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 Hypertension   . Joint pain   . Myalgia and myositis   . Neuropathy (HCC)   . Radicular pain of left lower extremity     Past Surgical History:  Procedure Laterality Date  . CERVICAL SPINE SURGERY  2007  . CESAREAN SECTION  [phone]  . DENTAL SURGERY  1986   wisdom tooth  . PARTIAL HYSTERECTOMY  2006    Allergies: Fentanyl and Penicillins  Medications: Prior to Admission medications   Medication Sig Start Date End Date Taking? Authorizing Provider  alclomethasone (ACLOVATE) 0.05 % cream Apply 1 application topically 2 (two) times daily as needed (Use on face).   Yes Historical Provider, MD  aspirin EC 81 MG tablet Take 81 mg by mouth daily.   Yes Historical Provider, MD  buPROPion (WELLBUTRIN XL) 300 MG 24 hr tablet Take 300 mg by mouth at bedtime.   Yes Historical Provider, MD  cephALEXin (KEFLEX) 500 MG capsule Take 500 mg by mouth daily. Ongoing treatment for prevention of UTIs   Yes Historical Provider, MD  cetirizine (ZYRTEC) 10 MG tablet Take 10 mg by mouth daily.   Yes Historical Provider, MD  cholecalciferol (VITAMIN D) 1000 units tablet Take 1,000 Units by mouth daily.   Yes Historical Provider, MD  citalopram (CELEXA) 20 MG tablet Take 20 mg by mouth daily.   Yes Historical Provider,  MD  docusate sodium (COLACE) 100 MG capsule Take 100 mg by mouth daily.   Yes Historical Provider, MD  folic acid (FOLVITE) 1 MG tablet Take 1 mg by mouth daily.   Yes Historical Provider, MD  gabapentin (NEURONTIN) 300 MG capsule Take 300 mg by mouth 4 (four) times daily.  11/11/12  Yes Historical Provider, MD  hydrochlorothiazide (HYDRODIURIL) 25 MG tablet Take 25 mg by mouth every morning.  11/12/12  Yes Historical Provider, MD  hydroxychloroquine (PLAQUENIL) 200 MG tablet Take 200 mg by mouth 2 (two) times daily.   Yes Historical Provider, MD  lisinopril (PRINIVIL,ZESTRIL) 10 MG tablet Take 10 mg by mouth every morning. [name] PA , [facility]  12/07/12  Yes Historical Provider, MD  lovastatin (MEVACOR) 10 MG tablet Take 10 mg by mouth every morning.   Yes Historical Provider, MD  Oxycodone HCl 10 MG TABS Take 10 mg by mouth 4 (four) times daily.   Yes Historical Provider, MD  predniSONE (DELTASONE) 5 MG tablet Take 5 mg by mouth daily.  11/16/12  Yes Historical Provider, MD  Turmeric 500 MG CAPS Take 1 capsule by mouth daily.   Yes Historical Provider, MD  zolpidem (AMBIEN) 10 MG tablet Take 10 mg by mouth at bedtime.  09/26/12  Yes Historical Provider, MD  betamethasone dipropionate 0.05 % lotion Apply 1 application topically 2 (two) times daily as needed (Use on scalp and ears).    Historical Provider, MD  triamcinolone cream (KENALOG) 0.1 % Apply 1 application topically 2 (two) times daily as needed (arms and legs).    Historical Provider, MD     Family History  Problem Relation Age of Onset  . Diabetes Mother     CHF  . Heart failure Father     HEART ATTACK  . Cancer - Other Maternal Grandmother     Social History   Social History  . Marital status: Married    Spouse name: N/A  . Number of children: 3  . Years of education: college   Occupational History  .  [employer]    clinic worker   Social History Main Topics  . Smoking status: Never Smoker  . Smokeless  tobacco: Never Used  . Alcohol use No  . Drug use: No  . Sexual activity: Not Asked   Other Topics Concern  . 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  All other systems are negative.  Review of Systems  Constitutional: Negative for activity change, appetite change, fatigue and fever.  Respiratory: Negative for shortness of breath.   Cardiovascular: Negative for chest pain.  Gastrointestinal: Negative for abdominal pain.  Neurological: Negative for weakness.  Psychiatric/Behavioral: Negative for behavioral problems and confusion.    Vital Signs: BP 132/79   Pulse 75   Temp 98.4 F (36.9 C) (Oral)   Ht 5' 1.5" (1.562 m)   Wt 208 lb (94.3 kg)   SpO2 98%   BMI 38.66 kg/m   Physical Exam  Constitutional: She is oriented to person, place, and time. She appears well-nourished.  Cardiovascular: Normal rate, regular rhythm and normal heart sounds.   Pulmonary/Chest: Effort normal and breath sounds normal. She has no wheezes.  Abdominal: Soft. Bowel sounds are normal. There is no tenderness.  Musculoskeletal: Normal range of motion.  Neurological: She is alert and oriented to person, place, and time.  Skin: Skin is warm and dry.  Psychiatric: She has a normal mood and affect. Her behavior is normal. Judgment and thought content normal.  Nursing note and vitals reviewed.   Mallampati Score:  MD Evaluation Airway: WNL Heart: WNL Abdomen: WNL Chest/ Lungs: WNL ASA  Classification: 2 Mallampati/Airway Score: One  Imaging: US Abdomen Limited Ruq  Result Date: 03/23/2016 CLINICAL DATA:  Elevated LFTs. EXAM: US ABDOMEN LIMITED - RIGHT UPPER QUADRANT COMPARISON:  CT 02/02/2011. FINDINGS: Gallbladder: Multiple gallstones measuring up to 1.3 cm. Gallbladder wall thickness  normal. Negative Murphy sign . Common bile duct: Diameter: 3.2 mm. Liver: Liver has a heterogeneous echotexture suggesting fatty infiltration and/or hepatocellular disease. No focal hepatic abnormality identified. IMPRESSION: 1.  Multiple gallstones. 2. Liver has a heterogeneous echotexture suggesting fatty infiltration and/or hepatocellular disease. Electronically Signed   By: Maisie Fus  Register   On: 03/23/2016 12:02    Labs:  CBC:  Recent Labs  04/02/16 1215  WBC 5.4  HGB 13.4  HCT 40.2  PLT 252    COAGS:  Recent Labs  04/02/16 1215  INR 1.04  APTT 34    BMP: No results for input(s): NA, K, CL, CO2, GLUCOSE, BUN, CALCIUM, CREATININE, GFRNONAA, GFRAA in the last 8760 hours.  Invalid input(s): CMP  LIVER FUNCTION TESTS: No results for input(s): BILITOT, AST, ALT, ALKPHOS, PROT, ALBUMIN in  the last 8760 hours.  TUMOR MARKERS: No results for input(s): AFPTM, CEA, CA199, CHROMGRNA in the last 8760 hours.  Assessment and Plan:  Elevated liver function tests Now scheduled for liver core bx Risks and Benefits discussed with the patient including, but not limited to bleeding, infection, damage to adjacent structures or low yield requiring additional tests. All of the patient's questions were answered, patient is agreeable to proceed. Consent signed and in chart.   Thank you for this interesting consult.  I greatly enjoyed meeting [name] and look forward to participating in their care.  A copy of this report was sent to the requesting provider on this date.  Electronically Signed: Ralene MuskratURPIN,Katisha Shimizu A 04/02/2016, 1:00 PM   I spent a total of  30 Minutes   in face to face in clinical consultation, greater than 50% of which was counseling/coordinating care for liver core bx

## 2016-04-02 NOTE — Discharge Instructions (Signed)
Liver Biopsy, Care After °Introduction °These instructions give you information on caring for yourself after your procedure. Your doctor may also give you more specific instructions. Call your doctor if you have any problems or questions after your procedure. °Follow these instructions at home: °· Rest at home for 1-2 days or as told by your doctor. °· Have someone stay with you for at least 24 hours. °· Do not do these things in the first 24 hours: °¨ Drive. °¨ Use machinery. °¨ Take care of other people. °¨ Sign legal documents. °¨ Take a bath or shower. °· There are many different ways to close and cover a cut (incision). For example, a cut can be closed with stitches, skin glue, or adhesive strips. Follow your doctor's instructions on: °¨ Taking care of your cut. °¨ Changing and removing your bandage (dressing). °¨ Removing whatever was used to close your cut. °· Do not drink alcohol in the first week. °· Do not lift more than 5 pounds or play contact sports for the first 2 weeks. °· Take medicines only as told by your doctor. For 1 week, do not take medicine that has aspirin in it or medicines like ibuprofen. °· Get your test results. °Contact a doctor if: °· A cut bleeds and leaves more than just a small spot of blood. °· A cut is red, puffs up (swells), or hurts more than before. °· Fluid or something else comes from a cut. °· A cut smells bad. °· You have a fever or chills. °Get help right away if: °· You have swelling, bloating, or pain in your belly (abdomen). °· You get dizzy or faint. °· You have a rash. °· You feel sick to your stomach (nauseous) or throw up (vomit). °· You have trouble breathing, feel short of breath, or feel faint. °· Your chest hurts. °· You have problems talking or seeing. °· You have trouble balancing or moving your arms or legs. °This information is not intended to replace advice given to you by your health care provider. Make sure you discuss any questions you have with your  health care provider. °Document Released: 11/05/2007 Document Revised: 07/04/2015 Document Reviewed: 03/24/2013 °© 2017 Elsevier ° °

## 2016-04-02 NOTE — Procedures (Signed)
Abnormal LFTs, possible autoimmune hepatitis  US RANDOM LIVER CORE BX  No comp Stable Path pending Full report in PACS

## 2016-08-10 ENCOUNTER — Encounter: Payer: Self-pay | Admitting: Neurology

## 2016-09-24 ENCOUNTER — Encounter: Payer: Self-pay | Admitting: Neurology

## 2016-09-28 ENCOUNTER — Encounter: Payer: Self-pay | Admitting: Neurology

## 2016-09-28 ENCOUNTER — Ambulatory Visit (INDEPENDENT_AMBULATORY_CARE_PROVIDER_SITE_OTHER): Payer: 59 | Admitting: Neurology

## 2016-09-28 VITALS — BP 116/79 | HR 69 | Ht 61.0 in | Wt 215.0 lb

## 2016-09-28 DIAGNOSIS — Z789 Other specified health status: Secondary | ICD-10-CM

## 2016-09-28 DIAGNOSIS — G4739 Other sleep apnea: Secondary | ICD-10-CM | POA: Insufficient documentation

## 2016-09-28 DIAGNOSIS — G4731 Primary central sleep apnea: Secondary | ICD-10-CM | POA: Diagnosis not present

## 2016-09-28 HISTORY — DX: Other sleep apnea: G47.39

## 2016-09-28 NOTE — Progress Notes (Signed)
SLEEP MEDICINE CLINIC   Provider:  Melvyn Novas, [place]  Primary Care Physician:  Richmond Campbell., PA-C   Referring Provider: Richmond Campbell., PA-C    Chief Complaint  Patient presents with  . New Patient (Initial Visit)    new patient, had a sleep study at least 5 years ago and pt was placed on cpap at that time. pt still currently uses her machine but states that it doesnt work well. interested in the new machines and masks    HPI:  [name] is a 56 y.o. female , seen here as in a referral/ revisit  from PA. Arlyce Dice for a re- evaluation of apnea , new sleep study with the goal of a new BiPAP machine.   [name] received her biPAP through our clinic in 2013 around [place], after she was diagnosed with apnea but did not respond well to CPAP therapy. Since CPAP therapy failed her she switched to BiPAP is much better results and she continues to use CPAP now compliantly. I called from her last compliance reports dated 08-10-2016, the patient's average user time was 5 hours and 59 minutes, she used to machine 25 out of 30 days, she is using a VPAP there is timed breath, this function is called ST, set at 16 cm inspiratory pressure, 10 cm expiratory pressure and a respiratory rate of 14 bpm, her residual AHI is 6.5 currently with some air leaks she needs reportedly a new machine because it switches to different pressures, and makes noises.   Sleep habits are as follows: The patient goes to bed around 10:15 but reads in bed for about an hour. She will then fall asleep, usually her CPAP is in place, she prefers to sleep on her side. She's using one pillow for head support. Her bed is flat, the bedroom is cool, quiet and dark. She shares a bedroom with her husband who has also commented on the noises that the machine makes. She does not report any nocturia, nocturnal palpitations, diaphoresis, nausea or headaches. She wakes in the morning at 6 AM usually before her alarm rings. When she  rises at that time she has slept for about 6.5 hours.  She does not longer feel refreshed and restored however. She used to have better sleep quality. She has a longstanding problem with CPAP compliance. Essentially , she doesn't like CPAP.    Sleep medical history and family sleep history: diagnosed with complex  OSA 2013, treatment emerging central apneas. Her daughter has Apnea, too.  Two Maternal uncles have OSA . The patient also carries a diagnosis of hypertension, depression, hyperlipidemia, insomnia, obesity, systemic lupus erythematosus, estrogen deficiency. Recent  BMI of 42.14   Social history:  Married, 3 daughters, all in their 27 's, caffeine : 1-2 cups in AM, Tobacco, never, ETOH, never.  1 grandson.  Shift work history : none.    Note from 3.5 years ago : CPAP clinic NURIYAH HANLINE is a 56 y.o. female  Is seen here as a yearly revisit  from  Kathee [place], [place]  for CPAP compliance.  The patient was referred today for a 2 problems as well, leg pain and numbness, bilaterally and Blepharospasm. Se did not bring her CPAP to the visit, but showed e her smart phone ap that measured motor activity. Most nights she gets 4-5 hours of rest , according to the activity tracker.   The patient presented on 07-16-2011 her Epworth was 14 points and she  had just been pulled by her daughter during a vacation that she stop breathing at night and snored loudly. Polysomnography to place interpreted thousand 13 and was split but CPAP trigger central  apnea is. She was finally responding to BiPAP at 16 cm/ to 10 cm water in combination with heated humidity and a nasal pillows .  The residual AHI on the last download was 5.8 and later 2.8. She is followed by  DME [facility] .  She had no longer trouble with nocturnal arousals sleep fragmentation,  nocturia or insomnia after starting on BiPAP. Her Epworth sleepiness score was 1 point in June 2013 [name] is been a patient of mine in the past I have followed her  for obstructive sleep apnea and BiPAP compliance, next scheduled for a December  visit 2014.     Review of Systems: Out of a complete 14 system review, the patient complains of only the following symptoms, and all other reviewed systems are negative.  [name] endorsed joint pain, aching muscles, respiratory allergies, snoring, fatigue insomnia and sleepiness the feeling of not getting enough sleep of confusion, depression and anxiety. She status post hysterectomy 2006 and cervical discectomy 2007 her Epworth sleepiness score was endorsed at 6 points, her fatigue severity at 63 points. The letter has never responded to positive airway pressure therapy at it seems that the patient suffered mainly from fatigue in context with an autoimmune disease.  Depression score 3/15   Social History   Social History  . Marital status: Married    Spouse name: N/A  . Number of children: 3  . Years of education: college   Occupational History  .  [employer]    clinic worker   Social History Main Topics  . Smoking status: Never Smoker  . Smokeless tobacco: Never Used  . Alcohol use No  . Drug use: No  . Sexual activity: Not on file   Other Topics Concern  . 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 Diabetes Mother        CHF  . Heart failure Father        HEART ATTACK  . Cancer - Other Maternal Grandmother     Past Medical History:  Diagnosis Date  . 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 Hypertension   . Joint pain   . Myalgia and myositis   . Neuropathy   . Radicular pain of left lower extremity     Past Surgical History:  Procedure Laterality Date  . CERVICAL SPINE SURGERY  2007  . CESAREAN SECTION  [id number]  . DENTAL SURGERY  1986   wisdom tooth  . PARTIAL HYSTERECTOMY  2006    Current Outpatient Prescriptions    Medication Sig Dispense Refill  . azaTHIOprine (IMURAN) 50 MG tablet Take 50 mg by mouth daily.    Marland Kitchen conjugated estrogens (PREMARIN) vaginal cream Place 1 Applicatorful vaginally every 3 (three) days.    Marland Kitchen alclomethasone (ACLOVATE) 0.05 % cream Apply 1 application topically 2 (two) times daily as needed (Use on face).    . betamethasone dipropionate 0.05 % lotion Apply 1 application topically 2 (two) times daily as needed (Use on scalp and ears).    Marland Kitchen buPROPion (WELLBUTRIN XL) 300 MG 24 hr tablet Take 300 mg by mouth at  bedtime.    . cetirizine (ZYRTEC) 10 MG tablet Take 10 mg by mouth daily.    . cholecalciferol (VITAMIN D) 1000 units tablet Take 1,000 Units by mouth daily.    . citalopram (CELEXA) 20 MG tablet Take 20 mg by mouth daily.    Marland Kitchen gabapentin (NEURONTIN) 300 MG capsule Take 300 mg by mouth 4 (four) times daily.     . hydrochlorothiazide (HYDRODIURIL) 25 MG tablet Take 25 mg by mouth every morning.     . hydroxychloroquine (PLAQUENIL) 200 MG tablet Take 200 mg by mouth 2 (two) times daily.    Marland Kitchen lisinopril (PRINIVIL,ZESTRIL) 10 MG tablet Take 10 mg by mouth every morning. [name] PA , [facility]     . lovastatin (MEVACOR) 10 MG tablet Take 10 mg by mouth every morning.    . Oxycodone HCl 10 MG TABS Take 10 mg by mouth 4 (four) times daily.    . predniSONE (DELTASONE) 5 MG tablet Take 5 mg by mouth daily.     Marland Kitchen triamcinolone cream (KENALOG) 0.1 % Apply 1 application topically 2 (two) times daily as needed (arms and legs).    . zolpidem (AMBIEN) 10 MG tablet Take 10 mg by mouth at bedtime.      No current facility-administered medications for this visit.     Allergies as of 09/28/2016 - Review Complete 04/02/2016  Allergen Reaction Noted  . Fentanyl Nausea And Vomiting 12/14/2012  . Penicillins Other (See Comments) 12/14/2012    Vitals: BP 116/79   Pulse 69   Ht 5\' 1"  (1.549 m)   Wt 215 lb (97.5 kg)   BMI 40.62 kg/m  Last Weight:  Wt Readings from Last 1  Encounters:  09/28/16 215 lb (97.5 kg)   JOA:CZYS mass index is 40.62 kg/m.     Last Height:   Ht Readings from Last 1 Encounters:  09/28/16 5\' 1"  (1.549 m)    Physical exam:  General: The patient is awake, alert and appears not in acute distress. The patient is well groomed. Head: Normocephalic, atraumatic. Neck is supple. Mallampati 4-5,  neck circumference: 16. Nasal airflow patent , Cardiovascular:  Regular rate and rhythm , without  murmurs or carotid bruit, and without distended neck veins. Respiratory: Lungs are clear to auscultation. Skin:  Without evidence of edema, or rash Trunk: BMI is 41.  Neurologic exam : The patient is awake and alert, oriented to place and time.   Attention span & concentration ability appears normal.  Speech is fluent,  without dysarthria, dysphonia or aphasia.  Mood and affect are appropriate.  Cranial nerves: Pupils are equal and briskly reactive to light. Extraocular movements  in vertical and horizontal planes intact and without nystagmus. Visual fields by finger perimetry are intact. Hearing to finger rub intact. Facial sensation intact to fine touch. Facial motor strength is symmetric and tongue and uvula move midline. Shoulder shrug was symmetrical.   Motor exam:   Normal tone, muscle bulk and symmetric strength in all extremities. Sensory:  Fine touch, pinprick and vibration were tested in all extremities.  Coordination:  Finger-to-nose maneuver  normal without evidence of ataxia, dysmetria or tremor. Gait and station: Patient walks without assistive device . Strength within normal limits. Stance is stable and normal. Turns with 3 Steps.  Deep tendon reflexes: in the  upper and lower extremities are symmetric and intact. Babinski maneuver response is downgoing.   Assessment:  After physical and neurologic examination, review of laboratory studies,  Personal review of imaging  studies, reports of other /same  Imaging studies, results of  polysomnography and / or neurophysiology testing and pre-existing records as far as provided in visit., my assessment is   1) it was my great delight to see [name] today after a long-time hiatus.  I found her sleep study from 04/01/2011 in which she was diagnosed with obstructive sleep apnea at an AHI of 25.7, REM AHI was 51.8 and she was titrated to CPAP without achieving reduction in the AHI. For this reason she was changed to a BiPAP and finally to a VPAP. She did the very best on VPAP I would like to say that she had complex central and obstructive sleep apnea associated with 02 desaturations. Her machine is now over 59 years old;  a ResMed VPAP ST, which has begun to make rattling noises at the patient feels that she no longer gets the same pressure delivered in a reliable fashion.  PLAN : I like to have the patient return for a Split - with PAP. Titration. If her insurance doesn't cover this , she must undergo a HST to confirm the AH, than I can order either a new  VPAP, or have her again on CPAP to confirm she still cannot use it.   The patient was advised of the nature of the diagnosed disorder , the treatment options and the  risks for general health and wellness arising from not treating the condition.  I spent more than 45 minutes of face to face time with the patient. Greater than 50% of time was spent in counseling and coordination of care. We have discussed the diagnosis and differential and I answered the patient's questions.        Melvyn Novas, MD 09/28/2016, 3:33 PM  Certified in Neurology by ABPN Certified in Sleep Medicine by [facility] [address], [address] [place], [place] [address]

## 2016-10-22 ENCOUNTER — Ambulatory Visit (INDEPENDENT_AMBULATORY_CARE_PROVIDER_SITE_OTHER): Payer: 59 | Admitting: Neurology

## 2016-10-22 DIAGNOSIS — G4731 Primary central sleep apnea: Secondary | ICD-10-CM | POA: Diagnosis not present

## 2016-10-22 DIAGNOSIS — G4739 Other sleep apnea: Secondary | ICD-10-CM

## 2016-10-22 DIAGNOSIS — Z789 Other specified health status: Secondary | ICD-10-CM

## 2016-10-26 NOTE — Addendum Note (Signed)
Addended by: Melvyn Novas on: 10/26/2016 05:08 PM   Modules accepted: Orders

## 2016-10-26 NOTE — Procedures (Signed)
PATIENT'S NAME:  [name], [name] DOB:      1960-07-20      MR#:    [id number]     DATE OF RECORDING: 10/22/2016 REFERRING M.D.:  Richmond Campbell PA-C Study Performed:   Polysomnography HISTORY:  [name], a 56 year old female patient with OSA, who  has been using BiPAP 16/ 12 cm since 2013 , after failing to respond to CPAP therapy. She now needs to qualify for a new machine.  The fatigue has never responded to positive airway pressure therapy as it seems that the patient suffered from fatigue in context with an autoimmune disease. The patient endorsed the Epworth Sleepiness Scale at 6 points her FSS was endorsed at 63 points.   The patient's weight 216 pounds with a height of 61 (inches), resulting in a BMI of 40.8 kg/m2. The patient's neck circumference measured 16 inches.  CURRENT MEDICATIONS: Imuran, Premarin, Wellbutrin, Zyrtec, Celexa, Neurontin, Hydrochlorothiazide, Plaquenil, Lisinopril, Mevacor, Prednisone, Kenalog, Ambien   PROCEDURE:  This is a multichannel digital polysomnogram utilizing the SomnoStar 11.2 system.  Electrodes and sensors were applied and monitored per AASM Specifications.   EEG, EOG, Chin and Limb EMG, were sampled at 200 Hz.  ECG, Snore and Nasal Pressure, Thermal Airflow, Respiratory Effort, CPAP Flow and Pressure, Oximetry was sampled at 50 Hz. Digital video and audio were recorded.      BASELINE STUDY  Lights Out was at 21:07 and Lights On at 04:59.  Total recording time (TRT) was 472.5 minutes, with a total sleep time (TST) of 377 minutes.   The patient's sleep latency was 32 minutes.  REM latency was 108 minutes.  The sleep efficiency was 79.8 %.     SLEEP ARCHITECTURE: WASO (Wake after sleep onset) was 63.5 minutes.  There were 73 minutes in Stage N1, 97.5 minutes Stage N2, 137 minutes Stage N3 and 69.5 minutes in Stage REM.  The percentage of Stage N1 was 19.4%, Stage N2 was 25.9%, Stage N3 was 36.3% and Stage R (REM sleep) was 18.4%.   RESPIRATORY ANALYSIS:   There were a total of 90 respiratory events:  0 apneas and 90 hypopneas with a hypopnea index of 14.3 /hour. The patient also had 1 respiratory event related arousal (RERAs).     The total APNEA/HYPOPNEA INDEX (AHI) was 14.3/hour and the total RESPIRATORY DISTURBANCE INDEX was 14.5 /hour.  8 events occurred in REM sleep and 164 events in NREM. The REM AHI was 6.9 /hour, versus a non-REM AHI of 16. The patient spent 45 minutes of total sleep time in the supine position and 332 minutes in non-supine. The supine AHI was 22.7 versus a non-supine AHI of 13.2.  OXYGEN SATURATION & C02:  The Wake baseline 02 saturation was 96%, with the lowest being 84%. Time spent below 89% saturation equaled 70 minutes.   PERIODIC LIMB MOVEMENTS:  The patient had a total of 0 Periodic Limb Movements.  The arousals were noted as: 74 were spontaneous, 0 were associated with PLMs, and 23 were associated with respiratory events. EKG was in keeping with normal sinus rhythm (NSR). Audio and video analysis did not show any abnormal or unusual movements, behaviors, phonations or vocalizations.  The patient took one bathroom break.  Loud Snoring was noted. The Periodic Limb Movement (PLM) index was 0 and the PLM Arousal index was 0/hour.  Post-study, the patient indicated that sleep was better than usual.    IMPRESSION:  1. Hypopnea/ Sleep Apnea (AHI was 14.3/hr.), not REM accentuated-  but positional dependent.  2. Hypoxia, prolonged at a total hypoxemia time of 70 minutes. 3. Primary loud Snoring, indicating upper air way resistance.   4. Repetitive Intrusions of Sleep, sleep fragmentation.   RECOMMENDATIONS:  1. Advise full-night, attended, PAP titration study to optimize therapy. The AHI of this study was too low to split at 2 AM.    2. Positional therapy to avoid supine sleep position is advised. 3. Avoid sedative-hypnotics which may worsen sleep apnea, as well as alcohol and tobacco (as applicable). 4. Advise to lose  weight, diet and exercise if not contraindicated (BMI 40.8). 5. Further information regarding OSA may be obtained from [employer] (www.sleepfoundation.org) or American Sleep Apnea Association (www.sleepapnea.org). 6. Avoid caffeine-containing beverages and chocolate. Caffeine intake less than 8 hours before bedtime can fragment ones sleep.  7. Consider dedicated sleep psychology referral if insomnia is of clinical concern.   8. A follow up appointment will be scheduled in the Sleep Clinic at [facility]. The referring provider will be notified of the results.      I certify that I have reviewed the entire raw data recording prior to the issuance of this report in accordance with the Standards of Accreditation of the American Academy of Sleep Medicine (AASM)    Melvyn Novas, MD       10-26-2016 Diplomat, American Board of Psychiatry and Neurology  Diplomat, American Board of Sleep Medicine Medical Director, [facility]

## 2016-10-27 ENCOUNTER — Telehealth: Payer: Self-pay | Admitting: Neurology

## 2016-10-27 NOTE — Telephone Encounter (Signed)
-----   Message from Melvyn Novas, MD sent at 10/26/2016  5:06 PM EDT ----- Please tell patient she had sleep apnea, mild and positional dependent .  Patient should stay on PAP therapy. I will ask her to return for a titration.

## 2016-10-27 NOTE — Telephone Encounter (Signed)
Called and spoke with the patient and made her aware that Dr Vickey Huger read her results and would like for her to come in for CPAP titration. Pt uses Bipap at home and she verbalized understanding with coming back in for this test. Pt will await to hear from the sleep lab

## 2016-11-13 ENCOUNTER — Other Ambulatory Visit: Payer: Self-pay | Admitting: Family Medicine

## 2016-11-13 DIAGNOSIS — Z1231 Encounter for screening mammogram for malignant neoplasm of breast: Secondary | ICD-10-CM

## 2016-11-16 ENCOUNTER — Ambulatory Visit (INDEPENDENT_AMBULATORY_CARE_PROVIDER_SITE_OTHER): Payer: 59 | Admitting: Neurology

## 2016-11-16 DIAGNOSIS — G4731 Primary central sleep apnea: Secondary | ICD-10-CM

## 2016-11-16 DIAGNOSIS — G4739 Other sleep apnea: Secondary | ICD-10-CM

## 2016-11-16 DIAGNOSIS — Z789 Other specified health status: Secondary | ICD-10-CM

## 2016-11-18 ENCOUNTER — Telehealth: Payer: Self-pay | Admitting: Neurology

## 2016-11-18 NOTE — Addendum Note (Signed)
Addended by: Melvyn Novas on: 11/18/2016 05:11 PM   Modules accepted: Orders

## 2016-11-18 NOTE — Procedures (Signed)
PATIENT'S NAME:  Beverly, Mendoza DOB:      1960/03/30      MR#:    [id number]     DATE OF RECORDING: 11/16/2016 REFERRING M.D.:  Richmond Campbell PA-C Study Performed:   Titration to CPAP HISTORY:  The patient returned based on results from PSG on 10/22/2016: Mild OSA with hypoxemia for 70 minutes. The total APNEA/HYPOPNEA INDEX (AHI) was 14.3/hour, REM AHI was 6.9 /hour. The patient had a supine AHI was 22.7.The patient endorsed the Epworth Sleepiness Scale at 6/24 points.  The patient's weight 216 pounds with a height of 61 (inches), resulting in a BMI of 40.8 kg/m2.The patient's neck circumference measured 16 inches.  CURRENT MEDICATIONS: Imuran, Premarin, Wellbutrin, Zyrtec, Celexa, Neurontin, Hydrochlorothiazide, Plaquenil, Lisinopril, Mevacor, Prednisone, Kenalog, Ambien.  PROCEDURE:  This is a multichannel digital polysomnogram utilizing the SomnoStar 11.2 system.  Electrodes and sensors were applied and monitored per AASM Specifications.   EEG, EOG, Chin and Limb EMG, were sampled at 200 Hz.  ECG, Snore and Nasal Pressure, Thermal Airflow, Respiratory Effort, CPAP Flow and Pressure, Oximetry was sampled at 50 Hz. Digital video and audio were recorded.      CPAP was initiated at 5 cmH20 with heated humidity per AASM split night standards and pressure was advanced to 10/10cmH20 because of hypopneas, apneas and desaturations.  At a PAP pressure of 9 cmH20, there was a reduction of the AHI to 0.0 with improvement of sleep efficiency and oxygen nadir to 91%.   Lights Out was at 22:42 and Lights On at 05:06. Total recording time (TRT) was 384 minutes, with a total sleep time (TST) of 278.5 minutes. The patient's sleep latency was 41.5 minutes with 0 minutes of wake time after sleep onset. REM latency was 142.5 minutes.  The sleep efficiency was 72.5 %.    SLEEP ARCHITECTURE: WASO (Wake after sleep onset) was 88 minutes.  There were 16.5 minutes in Stage N1, 196 minutes Stage N2, 27.5 minutes Stage N3 and  38.5 minutes in Stage REM.  The percentage of Stage N1 was 5.9%, Stage N2 was 70.4%, Stage N3 was 9.9% and Stage R (REM sleep) was 13.8%.   The arousals were noted as: 42 were spontaneous, 10 were associated with PLMs, and 9 were associated with respiratory events.  RESPIRATORY ANALYSIS:  There was a total of 9 respiratory events: 9 hypopneas with 0 respiratory event related arousals (RERAs).    The total APNEA/HYPOPNEA INDEX  (AHI) was 1.9 /hour and the total RESPIRATORY DISTURBANCE INDEX was 1.9 .hour  0 events occurred in REM sleep and 9 events in NREM. The REM AHI was 0.0 /hour versus a non-REM AHI of 2.25 /hour.  The patient spent 57 minutes of total sleep time in the supine position and 222 minutes in non-supine. The supine AHI was 0.0, versus a non-supine AHI of 2.4.  OXYGEN SATURATION & C02:  The baseline 02 saturation was 95%, with the lowest being 85%. Time spent below 89% saturation equaled 2.0 minutes. PERIODIC LIMB MOVEMENTS:    The patient had a total of 39 Periodic Limb Movements. The Periodic Limb Movement (PLM) index was 8.4 and the PLM Arousal index was 2.2 /hour. The arousals were noted as: 42 were spontaneous, 10 were associated with PLMs, and 9 were associated with respiratory events. Audio and video analysis did not show any abnormal or unusual movements, behaviors, phonations or vocalizations. No Nocturia. No Snoring was noted under CPAP. EKG was in keeping with normal sinus rhythm (NSR). Post-study,  the patient indicated that sleep was the same as usual. The patient was fitted with a Respironics Dream wear (Small) apparatus and appreciated the good fit and high comfort.  DIAGNOSIS 1. Obstructive Sleep Apnea with hypoxemia, corrected on CPAP at 9-10 cm water.    PLANS/RECOMMENDATIONS: auto-titration capable CPAP machine at 9 cm water pressure with 1 cm EPR, and a Respironics Dream wear (Small) interface.  A follow up appointment will be scheduled in the Sleep Clinic at  [facility].   Please call [phone] with any questions.      I certify that I have reviewed the entire raw data recording prior to the issuance of this report in accordance with the Standards of Accreditation of the American Academy of Sleep Medicine (AASM)    Melvyn Novas, M.D.    11-18-1016 Diplomat, American Board of Psychiatry and Neurology  Diplomat, American Board of Sleep Medicine Medical Director, [place] [facility]

## 2016-11-18 NOTE — Telephone Encounter (Signed)
I called pt. I advised pt that Dr. Vickey Huger reviewed their sleep study results and found that pt has sleep apnea. Dr. Vickey Huger recommends that pt starting CPAP. I reviewed PAP compliance expectations with the pt. Pt is agreeable to starting a CPAP. I advised pt that an order will be sent to a DME, [facility], and [facility] will call the pt within about one week after they file with the pt's insurance. [facility] will show the pt how to use the machine, fit for masks, and troubleshoot the CPAP if needed. A follow up appt was made for insurance purposes with Dr. Vickey Huger on Feb 04, 2017 at 8:30 am. Pt verbalized understanding to arrive 15 minutes early and bring their CPAP. A letter with all of this information in it will be mailed to the pt as a reminder. I verified with the pt that the address we have on file is correct. Pt verbalized understanding of results. Pt had no questions at this time but was encouraged to call back if questions arise.

## 2016-11-18 NOTE — Telephone Encounter (Signed)
-----   Message from Melvyn Novas, MD sent at 11/18/2016  5:11 PM EDT ----- Cc PA Arlyce Dice, please. DIAGNOSIS 1. Obstructive Sleep Apnea with hypoxemia, corrected on CPAP at  9-10 cm water.    PLANS/RECOMMENDATIONS: auto-titration capable CPAP machine at 9  cm water pressure with 1 cm EPR, and a Respironics Dream wear  (Small) interface.  A follow up appointment will be scheduled in the Sleep Clinic at  [facility].  Please call [phone] with  any questions.

## 2016-11-19 ENCOUNTER — Encounter: Payer: Self-pay | Admitting: Neurology

## 2016-12-01 NOTE — Telephone Encounter (Signed)
Patient is requesting that an order be sent to [facility] for her CPAP and supplies. She does not want to use [facility]. A returned call is not needed unless there are questions.

## 2016-12-02 NOTE — Telephone Encounter (Signed)
Called the patient and after [facility] chosen to stay with [facility]. I will contact [facility] and let them know the work number is best contact info for them to contact her thru that number.

## 2016-12-21 ENCOUNTER — Ambulatory Visit: Payer: 59

## 2017-02-04 ENCOUNTER — Ambulatory Visit: Payer: Self-pay | Admitting: Neurology

## 2017-02-15 ENCOUNTER — Ambulatory Visit
Admission: RE | Admit: 2017-02-15 | Discharge: 2017-02-15 | Disposition: A | Discharge status: Home / Self Care | Payer: 59 | Source: Ambulatory Visit | Attending: Family Medicine | Admitting: Family Medicine

## 2017-02-15 DIAGNOSIS — Z1231 Encounter for screening mammogram for malignant neoplasm of breast: Secondary | ICD-10-CM

## 2017-03-05 NOTE — Progress Notes (Deleted)
[facility]  PATIENT: [name] DOB: Feb 08, 1961   REASON FOR VISIT: *** HISTORY FROM:    HISTORY OF PRESENT ILLNESS: 09/28/16 CDMary S [name] is a 57 y.o. female , seen here as in a referral/ revisit  from PA. [name] for a re- evaluation of apnea , new sleep study with the goal of a new BiPAP machine.   [name] received her biPAP through our clinic in 2013 around [place], after she was diagnosed with apnea but did not respond well to CPAP therapy. Since CPAP therapy failed her she switched to BiPAP is much better results and she continues to use CPAP now compliantly. I called from her last compliance reports dated 08-10-2016, the patient's average user time was 5 hours and 59 minutes, she used to machine 25 out of 30 days, she is using a VPAP there is timed breath, this function is called ST, set at 16 cm inspiratory pressure, 10 cm expiratory pressure and a respiratory rate of 14 bpm, her residual AHI is 6.5 currently with some air leaks she needs reportedly a new machine because it switches to different pressures, and makes noises.   Sleep habits are as follows: The patient goes to bed around 10:15 but reads in bed for about an hour. She will then fall asleep, usually her CPAP is in place, she prefers to sleep on her side. She's using one pillow for head support. Her bed is flat, the bedroom is cool, quiet and dark. She shares a bedroom with her husband who has also commented on the noises that the machine makes. She does not report any nocturia, nocturnal palpitations, diaphoresis, nausea or headaches. She wakes in the morning at 6 AM usually before her alarm rings. When she rises at that time she has slept for about 6.5 hours.  She does not longer feel refreshed and restored however. She used to have better sleep quality. She has a longstanding problem with CPAP compliance. Essentially , she doesn't like CPAP.   REVIEW OF SYSTEMS: Full 14 system review of systems  performed and notable only for those listed, all others are neg:  Constitutional: neg  Cardiovascular: neg Ear/Nose/Throat: neg  Skin: neg Eyes: neg Respiratory: neg Gastroitestinal: neg  Hematology/Lymphatic: neg  Endocrine: neg Musculoskeletal:neg Allergy/Immunology: neg Neurological: neg Psychiatric: neg Sleep : neg   ALLERGIES: Allergies  Allergen Reactions  . Fentanyl Nausea And Vomiting  . Penicillins Other (See Comments)    Childhood    HOME MEDICATIONS: Outpatient Medications Prior to Visit  Medication Sig Dispense Refill  . alclomethasone (ACLOVATE) 0.05 % cream Apply 1 application topically 2 (two) times daily as needed (Use on face).    Marland Kitchen. azaTHIOprine (IMURAN) 50 MG tablet Take 50 mg by mouth daily.    . betamethasone dipropionate 0.05 % lotion Apply 1 application topically 2 (two) times daily as needed (Use on scalp and ears).    Marland Kitchen. buPROPion (WELLBUTRIN XL) 300 MG 24 hr tablet Take 300 mg by mouth at bedtime.    . cetirizine (ZYRTEC) 10 MG tablet Take 10 mg by mouth daily.    . cholecalciferol (VITAMIN D) 1000 units tablet Take 1,000 Units by mouth daily.    . citalopram (CELEXA) 20 MG tablet Take 20 mg by mouth daily.    Marland Kitchen. conjugated estrogens (PREMARIN) vaginal cream Place 1 Applicatorful vaginally every 3 (three) days.    Marland Kitchen. gabapentin (NEURONTIN) 300 MG capsule Take 300 mg by mouth 4 (four) times daily.     .Marland Kitchen  hydrochlorothiazide (HYDRODIURIL) 25 MG tablet Take 25 mg by mouth every morning.     . hydroxychloroquine (PLAQUENIL) 200 MG tablet Take 200 mg by mouth 2 (two) times daily.    Marland Kitchen lisinopril (PRINIVIL,ZESTRIL) 10 MG tablet Take 10 mg by mouth every morning. [name] PA , [facility]     . lovastatin (MEVACOR) 10 MG tablet Take 10 mg by mouth every morning.    . Oxycodone HCl 10 MG TABS Take 10 mg by mouth 4 (four) times daily.    . predniSONE (DELTASONE) 5 MG tablet Take 5 mg by mouth daily.     Marland Kitchen triamcinolone cream (KENALOG) 0.1 % Apply 1  application topically 2 (two) times daily as needed (arms and legs).    . zolpidem (AMBIEN) 10 MG tablet Take 10 mg by mouth at bedtime.      No facility-administered medications prior to visit.     PAST MEDICAL HISTORY: Past Medical History:  Diagnosis Date  . 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 Hypertension   . Joint pain   . Myalgia and myositis   . Neuropathy   . Radicular pain of left lower extremity     PAST SURGICAL HISTORY: Past Surgical History:  Procedure Laterality Date  . CERVICAL SPINE SURGERY  2007  . CESAREAN SECTION  [phone]  . DENTAL SURGERY  1986   wisdom tooth  . PARTIAL HYSTERECTOMY  2006    FAMILY HISTORY: Family History  Problem Relation Age of Onset  . Diabetes Mother        CHF  . Heart failure Father        HEART ATTACK  . Cancer - Other Maternal Grandmother     SOCIAL HISTORY: Social History   Socioeconomic History  . Marital status: Married    Spouse name: Not on file  . Number of children: 3  . Years of education: college  . Highest education level: Not on file  Social Needs  . Financial resource strain: Not on file  . Food insecurity - worry: Not on file  . Food insecurity - inability: Not on file  . Transportation needs - medical: Not on file  . Transportation needs - non-medical: Not on file  Occupational History    Employer: [employer]    Comment: clinic worker  Tobacco Use  . Smoking status: Never Smoker  . Smokeless tobacco: Never Used  Substance and Sexual Activity  . Alcohol use: No  . Drug use: No  . Sexual activity: Not on file  Other Topics Concern  . 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 There is no height or weight on file to calculate BMI.  Generalized: Well developed, in no acute distress  Head: normocephalic and atraumatic,.  Oropharynx benign  Neck: Supple, no carotid bruits  Cardiac: Regular rate rhythm, no murmur  Musculoskeletal: No deformity   Neurological examination   Mentation: Alert oriented to time, place, history taking. Attention span and concentration appropriate. Recent and remote memory intact.  Follows all commands speech and language fluent.   Cranial nerve II-XII: Fundoscopic exam reveals sharp disc margins.Pupils were equal round reactive to light extraocular movements were full, visual field were full on confrontational test. Facial sensation and strength were normal. hearing was intact to finger rubbing bilaterally. Uvula  tongue midline. head turning and shoulder shrug were normal and symmetric.Tongue protrusion into cheek strength was normal. Motor: normal bulk and tone, full strength in the BUE, BLE, fine finger movements normal, no pronator drift. No focal weakness Sensory: normal and symmetric to light touch, pinprick, and  Vibration, proprioception  Coordination: finger-nose-finger, heel-to-shin bilaterally, no dysmetria Reflexes: Brachioradialis 2/2, biceps 2/2, triceps 2/2, patellar 2/2, Achilles 2/2, plantar responses were flexor bilaterally. Gait and Station: Rising up from seated position without assistance, normal stance,  moderate stride, good arm swing, smooth turning, able to perform tiptoe, and heel walking without difficulty. Tandem gait is steady  DIAGNOSTIC DATA (LABS, IMAGING, TESTING) - I reviewed patient records, labs, notes, testing and imaging myself where available.  Lab Results  Component Value Date   WBC 5.4 04/02/2016   HGB 13.4 04/02/2016   HCT 40.2 04/02/2016   MCV 92.6 04/02/2016   PLT 252 04/02/2016   No results found for: NA, K, CL, CO2, GLUCOSE, BUN, CREATININE, CALCIUM, PROT, ALBUMIN, AST, ALT, ALKPHOS, BILITOT, GFRNONAA, GFRAA No results found for: CHOL, HDL, LDLCALC, LDLDIRECT, TRIG, CHOLHDL No results found for: UEAV4U No results found for:  VITAMINB12 No results found for: TSH  ***  ASSESSMENT AND PLAN  57 y.o. year old female  has a past medical history of Facial nerve spasm (12/14/2012), Hypertension, Joint pain, Myalgia and myositis, Neuropathy, and Radicular pain of left lower extremity. here with ***    Cline Crock, [facility], APRN  [facility] [address] [place], [place] [address] [phone]

## 2017-03-08 ENCOUNTER — Ambulatory Visit: Payer: Self-pay | Admitting: Nurse Practitioner

## 2017-03-08 ENCOUNTER — Telehealth: Payer: Self-pay | Admitting: *Deleted

## 2017-03-08 DIAGNOSIS — Z79891 Long term (current) use of opiate analgesic: Secondary | ICD-10-CM | POA: Insufficient documentation

## 2017-03-08 NOTE — Telephone Encounter (Signed)
Patient cancelled FU today, re: fever.

## 2017-03-09 ENCOUNTER — Encounter: Payer: Self-pay | Admitting: Nurse Practitioner

## 2017-07-28 ENCOUNTER — Telehealth: Payer: Self-pay | Admitting: Neurology

## 2017-07-28 NOTE — Telephone Encounter (Signed)
Called the patient. Her apt was scheduled originally in December and then appears the pt had not followed for her initial cpap visit. She has an upcoming apt on 09/29/17 and we need to get her seen sooner. There was some cancellations on 07/29/17 I was going to offer the pt. There was no answer. LVM for the pt to call back.  When the patient calls back please see if we can move her apt up sooner then the scheduled apt. It can be with NP or MD.

## 2017-07-29 NOTE — Telephone Encounter (Signed)
Pt returned call. Upon reviewing the chart the patient missed her initial follow up apt due to being sick in January she never rescheduled. I informed her that insurance typically likes to see followed up within 60 days of the machine. I had not received a notification from [facility] but thought would still be beneficial to move her apt up sooner. Offered an apt 2 pm on Monday 6/24. Pt verbalized understanding of check in at 1:30 for 2 apt.

## 2017-08-01 ENCOUNTER — Encounter: Payer: Self-pay | Admitting: Neurology

## 2017-08-02 ENCOUNTER — Encounter: Payer: Self-pay | Admitting: Adult Health

## 2017-08-02 ENCOUNTER — Ambulatory Visit: Payer: 59 | Admitting: Adult Health

## 2017-08-02 VITALS — BP 108/63 | HR 83 | Ht 61.0 in | Wt 198.0 lb

## 2017-08-02 DIAGNOSIS — Z9989 Dependence on other enabling machines and devices: Secondary | ICD-10-CM | POA: Diagnosis not present

## 2017-08-02 DIAGNOSIS — G4733 Obstructive sleep apnea (adult) (pediatric): Secondary | ICD-10-CM | POA: Diagnosis not present

## 2017-08-02 NOTE — Progress Notes (Signed)
PATIENT: [name] DOB: [birth year]  REASON FOR VISIT: follow up HISTORY FROM: patient  HISTORY OF PRESENT ILLNESS: Today 08/02/17 [name] is a 57 year old female with a history of obstructive sleep apnea on CPAP.  She returns today for follow-up.  Her CPAP download indicates that she used her machine 27 out of 30 days for compliance of 90%.  She use her machine greater than 4 hours 22 out of 30 days for compliance of 73%.  On average she uses her machine 5 hours and 54 minutes.  Her residual AHI is 3.9 on 9 cm of water with EPR of 1.  She does not have a significant leak.  She states that there are some nights that she does not sleep longer than 4 hours because she may be up reading.  She reports that she has noticed a benefit with the CPAP machine.  States that she feels more rested throughout the day.  She returns today for an evaluation.  HISTORY [name] received her biPAP through our clinic in 2013 around [place], after she was diagnosed with apnea but did not respond well to CPAP therapy. Since CPAP therapy failed her she switched to BiPAP is much better results and she continues to use CPAP now compliantly. I called from her last compliance reports dated 08-10-2016, the patient's average user time was 5 hours and 59 minutes, she used to machine 25 out of 30 days, she is using a VPAP there is timed breath, this function is called ST, set at 16 cm inspiratory pressure, 10 cm expiratory pressure and a respiratory rate of 14 bpm, her residual AHI is 6.5 currently with some air leaks she needs reportedly a new machine because it switches to different pressures, and makes noises.   REVIEW OF SYSTEMS: Out of a complete 14 system review of symptoms, the patient complains only of the following symptoms, and all other reviewed systems are negative.  See HPI  ALLERGIES: Allergies  Allergen Reactions  . Fentanyl Nausea And Vomiting  . Penicillins Other (See Comments)    Childhood     HOME MEDICATIONS: Outpatient Medications Prior to Visit  Medication Sig Dispense Refill  . alclomethasone (ACLOVATE) 0.05 % cream Apply 1 application topically 2 (two) times daily as needed (Use on face).    Marland Kitchen azaTHIOprine (IMURAN) 50 MG tablet Take 50 mg by mouth daily.    . betamethasone dipropionate 0.05 % lotion Apply 1 application topically 2 (two) times daily as needed (Use on scalp and ears).    Marland Kitchen buPROPion (WELLBUTRIN XL) 300 MG 24 hr tablet Take 300 mg by mouth at bedtime.    . cetirizine (ZYRTEC) 10 MG tablet Take 10 mg by mouth daily.    . citalopram (CELEXA) 20 MG tablet Take 20 mg by mouth daily.    Marland Kitchen conjugated estrogens (PREMARIN) vaginal cream Place 1 Applicatorful vaginally every 3 (three) days.    Marland Kitchen gabapentin (NEURONTIN) 300 MG capsule Take 300 mg by mouth 4 (four) times daily.     . hydrochlorothiazide (HYDRODIURIL) 25 MG tablet Take 25 mg by mouth every morning.     . hydroxychloroquine (PLAQUENIL) 200 MG tablet Take 200 mg by mouth 2 (two) times daily.    Marland Kitchen lisinopril (PRINIVIL,ZESTRIL) 10 MG tablet Take 10 mg by mouth every morning. [name] PA , [facility]     . lovastatin (MEVACOR) 10 MG tablet Take 10 mg by mouth every morning.    . Oxycodone HCl 10 MG TABS  Take 10 mg by mouth 4 (four) times daily.    . predniSONE (DELTASONE) 5 MG tablet Take 5 mg by mouth daily.     Marland Kitchen triamcinolone cream (KENALOG) 0.1 % Apply 1 application topically 2 (two) times daily as needed (arms and legs).    . zolpidem (AMBIEN) 10 MG tablet Take 10 mg by mouth at bedtime.     . cholecalciferol (VITAMIN D) 1000 units tablet Take 1,000 Units by mouth daily.     No facility-administered medications prior to visit.     PAST MEDICAL HISTORY: Past Medical History:  Diagnosis Date  . 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 Hypertension   . Joint pain   . Myalgia and myositis   . Neuropathy   . Radicular pain of left lower extremity     PAST SURGICAL HISTORY: Past Surgical History:   Procedure Laterality Date  . CERVICAL SPINE SURGERY  2007  . CESAREAN SECTION  [phone]  . DENTAL SURGERY  1986   wisdom tooth  . PARTIAL HYSTERECTOMY  2006    FAMILY HISTORY: Family History  Problem Relation Age of Onset  . Diabetes Mother        CHF  . Heart failure Father        HEART ATTACK  . Cancer - Other Maternal Grandmother     SOCIAL HISTORY: Social History   Socioeconomic History  . Marital status: Married    Spouse name: Not on file  . Number of children: 3  . Years of education: college  . Highest education level: Not on file  Occupational History    Employer: [employer]    Comment: clinic worker  Social Needs  . Financial resource strain: Not on file  . Food insecurity:    Worry: Not on file    Inability: Not on file  . Transportation needs:    Medical: Not on file    Non-medical: Not on file  Tobacco Use  . Smoking status: Never Smoker  . Smokeless tobacco: Never Used  Substance and Sexual Activity  . Alcohol use: No  . Drug use: No  . Sexual activity: Not on file  Lifestyle  . Physical activity:    Days per week: Not on file    Minutes per session: Not on file  . Stress: Not on file  Relationships  . Social connections:    Talks on phone: Not on file    Gets together: Not on file    Attends religious service: Not on file    Active member of club or organization: Not on file    Attends meetings of clubs or organizations: Not on file    Relationship status: Not on file  . Intimate partner violence:    Fear of current or ex partner: Not on file    Emotionally abused: Not on file    Physically abused: Not on file    Forced sexual activity: Not on file  Other Topics Concern  . 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  Generalized: Well developed, in no acute distress   Neurological examination  Mentation: Alert oriented to time, place, history taking. Follows all commands speech  and language fluent Cranial nerve II-XII: Pupils were equal round reactive to light. Extraocular movements were full, visual field were full on confrontational test. Facial sensation and strength were normal. Uvula tongue midline. Head turning and shoulder shrug  were normal and symmetric.  Neck circumference 15 inches, Mallampati 4 +  Motor: The motor testing reveals 5 over 5 strength of all 4 extremities. Good symmetric motor tone is noted throughout.  Sensory: Sensory testing is intact to soft touch on all 4 extremities. No evidence of extinction is noted.  Coordination: Cerebellar testing reveals good finger-nose-finger and heel-to-shin bilaterally.  Gait and station: Gait is normal. Tandem gait is normal. Romberg is negative. No drift is seen.  Reflexes: Deep tendon reflexes are symmetric and normal bilaterally.   DIAGNOSTIC DATA (LABS, IMAGING, TESTING) - I reviewed patient records, labs, notes, testing and imaging myself where available.  Lab Results  Component Value Date   WBC 5.4 04/02/2016   HGB 13.4 04/02/2016   HCT 40.2 04/02/2016   MCV 92.6 04/02/2016   PLT 252 04/02/2016      ASSESSMENT AND PLAN 57 y.o. year old female  has a past medical history of Facial nerve spasm (12/14/2012), Hypertension, Joint pain, Myalgia and myositis, Neuropathy, and Radicular pain of left lower extremity. here with:  1.  Obstructive sleep apnea on CPAP  Overall the patient is doing well.  Her CPAP download shows excellent compliance and good treatment of her apnea.  She is encouraged to continue using the CPAP nightly and greater than 4 hours each night.  She is advised that if her symptoms worsen or she develops new symptoms she should let us know.  She will follow-up in 6 months or sooner if  needed.   I spent 15 minutes with the patient. 50% of this time was spent   Butch PennyMegan Cristel Rail, MSN, NP-C 08/02/2017, 1:52 PM [facility] [address], [address] [place], [place] [id number] [phone]

## 2017-08-02 NOTE — Patient Instructions (Signed)
Your Plan:  Continue using CPAP nightly and >4 hours each night If your symptoms worsen or you develop new symptoms please let us know.   Thank you for coming to see us at [facility]. I hope we have been able to provide you high quality care today.  You may receive a patient satisfaction survey over the next few weeks. We would appreciate your feedback and comments so that we may continue to improve ourselves and the health of our patients.  

## 2017-09-10 NOTE — Progress Notes (Signed)
I agree with the assessment and plan as directed by NP .The patient is known to me .   Beverly Kenyon, MD  

## 2017-09-29 ENCOUNTER — Ambulatory Visit: Payer: 59 | Admitting: Neurology

## 2017-11-04 ENCOUNTER — Telehealth: Payer: Self-pay | Admitting: Neurology

## 2017-11-04 ENCOUNTER — Other Ambulatory Visit: Payer: Self-pay | Admitting: Neurology

## 2017-11-04 DIAGNOSIS — M329 Systemic lupus erythematosus, unspecified: Secondary | ICD-10-CM

## 2017-11-04 NOTE — Telephone Encounter (Signed)
Right now I withdrew the case until after the patient see's Dr. Vickey Huger on Monday 11/08/17.

## 2017-11-04 NOTE — Telephone Encounter (Signed)
Called the pt to make her aware that we had seen a report from her rhematologist advising that we completed a MRI of her brain. Order was placed today. Informed the patient that she may be getting a call to have that scheduled. The patient states she may have to wait until the first of the year before completing the MRI depending on how much it cost and how much insurance will cover. I advised her just to contact the imaging place once she knows she is ready. Pt verbalized understanding. Patient asked if Dr Vickey Huger had any openings available I was able to schedule the patient for a opening on Monday 9/30. Pt will need MMSE/MOCA completed

## 2017-11-04 NOTE — Telephone Encounter (Signed)
[employer] pending faxed clinical notes  °

## 2017-11-08 ENCOUNTER — Ambulatory Visit: Payer: 59 | Admitting: Neurology

## 2017-11-08 ENCOUNTER — Encounter: Payer: Self-pay | Admitting: Neurology

## 2017-11-08 VITALS — BP 123/82 | HR 74 | Ht 61.0 in | Wt 200.0 lb

## 2017-11-08 DIAGNOSIS — R4189 Other symptoms and signs involving cognitive functions and awareness: Secondary | ICD-10-CM | POA: Diagnosis not present

## 2017-11-08 DIAGNOSIS — R413 Other amnesia: Secondary | ICD-10-CM | POA: Diagnosis not present

## 2017-11-08 DIAGNOSIS — G053 Encephalitis and encephalomyelitis in diseases classified elsewhere: Secondary | ICD-10-CM

## 2017-11-08 DIAGNOSIS — M3219 Other organ or system involvement in systemic lupus erythematosus: Secondary | ICD-10-CM

## 2017-11-08 HISTORY — DX: Encephalitis and encephalomyelitis in diseases classified elsewhere: G05.3

## 2017-11-08 HISTORY — DX: Other organ or system involvement in systemic lupus erythematosus: M32.19

## 2017-11-08 NOTE — Progress Notes (Addendum)
PATIENT: [name] DOB: 09-14-1960  REASON FOR VISIT: follow up 2019 HISTORY FROM: patient alone - OSA compliance to CPAP .  HISTORY OF PRESENT ILLNESS:     [name] is a 57 year old female with a history of obstructive sleep apnea on CPAP. 11/08/17.   Today's visit was meant to actually evaluate not so much her compliance for sleep apnea, but her cognitive developments. She had mentioned to her rheumatologist that she had become less able to multitask, more forgetful, even sequential steps would sometimes be broken up and she would find herself losing her train of thought.  Word retrieval has also been difficult, and she has been excessively daytime sleepy- ( which could be of course related to apnea if not well treated).  She has make make errors at work which have affected her performance, and she has noted that even in her free time reading a book can be a challenge, as she forgets what she read just recently.  She has had sudden memory lapses, she falls asleep with little warning and she is now unsafe to drive. She was asked to car pool- her commute is 7 minutes long ! Could she have sleep attacks or seizures, she asked.  There are also multiple pains and depressed mood which I cannot address in today's visit. I cannot follow her for fibromyalgia.    These seem to be more rheumatological he related, however I appreciate Dr. Shawnee Knapp extensive note.  He summarized her conditions as systemic lupus erythematosus, degenerative disc disease at the lumbar region and cervical region, polyarticular psoriatic arthritis, fibromyalgia, autoimmune hepatitis, irritable bowel syndrome and cognitive dysfunction.  The patient saw Dr. Dierdre Forth on 25 September just last week.  Her current list of medications does not show new medication except lisinopril was given in combination with hydrochlorothiazide and lovastatin has been put on hold.  Her prednisone dose has been increased to 5.5 to 10 mg daily.  She  is taking nitrofurantoin to avoid recurrent urinary tract infections, and she has been started on Viberzi 100 mg twice daily for IBS.   She is here upon special request of her rheumatologist, Dr. Alben Deeds, who treats her for lupus, and she requires a brain MRI after today's visit. He would like [place] to look at cerebritis.     NP- note: no longer central apneas after patient weaned off narcotic pain medications, now tolerating CPAP.   She returns today for follow-up.  Her CPAP download indicates that she used her machine 27 out of 30 days for compliance of 90%.  She use her machine greater than 4 hours 22 out of 30 days for compliance of 73%.  On average she uses her machine 5 hours and 54 minutes.  Her residual AHI is 3.9 on 9 cm of water with EPR of 1.  She does not have a significant leak.  She states that there are some nights that she does not sleep longer than 4 hours because she may be up reading.  She reports that she has noticed a benefit with the CPAP machine.  States that she feels more rested throughout the day.  She returns today for an evaluation.  HISTORY . Central sleep apnea dx in 2013- [name] received her BiPAP through our clinic in 2013 around [place], after she was diagnosed with apnea but did not respond well to CPAP therapy. Since CPAP therapy failed her she switched to BiPAP is much better results and she continues to use  PAP therapy now compliantly. I called from her last compliance reports dated 08-10-2016, the patient's average user time was 5 hours and 59 minutes, she used to machine 25 out of 30 days, she is using now a  VPAP there is timed breath rate , this function is called ST, set at 16 cm inspiratory pressure, 10 cm expiratory pressure and a respiratory rate of 14 bpm, her residual AHI is 6.5 currently with some air leaks she needs reportedly a new machine because it switches to different pressures, and makes noises.   REVIEW OF SYSTEMS: Out of a complete 14 system  review of symptoms, the patient complains only of the following symptoms, and all other reviewed systems are negative. Today's visit is dedicated to the patient's feeling that her cognitive function has declined, she has been losing her train of thoughts, short-term memory is affected, she has made errors at work may be easier distractible, but also feels that multitasking is in a much greater challenge.  She also suffers from sleep apnea which is now obstructive sleep apnea after she had been weaned off narcotic pain medication.  She has still irritable bowel syndrome which affects her sleep as it fragmented sleep through multiple bathroom called at night.  She feels that she is constantly in pain but also very exhausted.  See HPI  ALLERGIES: Allergies  Allergen Reactions  . Fentanyl Nausea And Vomiting  . Penicillins Other (See Comments)    Childhood    HOME MEDICATIONS: Outpatient Medications Prior to Visit  Medication Sig Dispense Refill  . alclomethasone (ACLOVATE) 0.05 % cream Apply 1 application topically 2 (two) times daily as needed (Use on face).    Marland Kitchen azaTHIOprine (IMURAN) 50 MG tablet Take 50 mg by mouth daily.    . betamethasone dipropionate 0.05 % lotion Apply 1 application topically 2 (two) times daily as needed (Use on scalp and ears).    Marland Kitchen buPROPion (WELLBUTRIN XL) 300 MG 24 hr tablet Take 300 mg by mouth at bedtime.    . cetirizine (ZYRTEC) 10 MG tablet Take 10 mg by mouth daily.    . citalopram (CELEXA) 20 MG tablet Take 20 mg by mouth daily.    . clotrimazole (MYCELEX) 10 MG troche   0  . conjugated estrogens (PREMARIN) vaginal cream Place 1 Applicatorful vaginally every 3 (three) days.    . Eluxadoline (VIBERZI) 100 MG TABS Take by mouth 2 (two) times daily.    Marland Kitchen gabapentin (NEURONTIN) 300 MG capsule Take 300 mg by mouth 4 (four) times daily.     . hydrochlorothiazide (HYDRODIURIL) 25 MG tablet Take 25 mg by mouth every morning.     . hydroxychloroquine (PLAQUENIL) 200  MG tablet Take 200 mg by mouth 2 (two) times daily.    Marland Kitchen lisinopril (PRINIVIL,ZESTRIL) 10 MG tablet Take 10 mg by mouth every morning. [name] PA , [facility]     . lovastatin (MEVACOR) 10 MG tablet Take 10 mg by mouth every morning.    . nitrofurantoin (MACRODANTIN) 50 MG capsule nitrofurantoin macrocrystal 50 mg capsule  TK 1 C PO QD    . Oxycodone HCl 10 MG TABS Take 10 mg by mouth 4 (four) times daily.    . pramoxine-hydrocortisone (PROCTOCREAM-HC) 1-1 % rectal cream     . predniSONE (DELTASONE) 5 MG tablet Take 5 mg by mouth daily.     Marland Kitchen triamcinolone cream (KENALOG) 0.1 % Apply 1 application topically 2 (two) times daily as needed (arms and legs).    Marland Kitchen  zolpidem (AMBIEN) 10 MG tablet Take 10 mg by mouth at bedtime.      No facility-administered medications prior to visit.     PAST MEDICAL HISTORY: Past Medical History:  Diagnosis Date  . 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 Hypertension   . Joint pain   . Myalgia and myositis   . Neuropathy   . Radicular pain of left lower extremity   OSA since being no longer on Narcotic pain medication, previously diagnosed with central sleep apnea , requiered biPAP/ ST in 2013.  PAST SURGICAL HISTORY: Past Surgical History:  Procedure Laterality Date  . CERVICAL SPINE SURGERY  2007  . CESAREAN SECTION  [phone]  . DENTAL SURGERY  1986   wisdom tooth  . PARTIAL HYSTERECTOMY  2006    FAMILY HISTORY: Family History  Problem Relation Age of Onset  . Diabetes Mother        CHF  . Heart failure Father        HEART ATTACK  . Cancer - Other Maternal Grandmother     SOCIAL HISTORY: Social History   Socioeconomic History  . Marital status: Married    Spouse name: Not on file  . Number of children: 3  . Years of education: college  . Highest education level: Not on file  Occupational History    Employer: [employer]    Comment: clinic worker  Social Needs  . Financial resource strain: Not on file  . Food insecurity:     Worry: Not on file    Inability: Not on file  . Transportation needs:    Medical: Not on file    Non-medical: Not on file  Tobacco Use  . Smoking status: Never Smoker  . Smokeless tobacco: Never Used  Substance and Sexual Activity  . Alcohol use: No  . Drug use: No  . Sexual activity: Not on file  Lifestyle  . Physical activity:    Days per week: Not on file    Minutes per session: Not on file  . Stress: Not on file  Relationships  . Social connections:    Talks on phone: Not on file    Gets together: Not on file    Attends religious service: Not on file    Active member of club or organization: Not on file    Attends meetings of clubs or organizations: Not on file    Relationship status: Not on file  . Intimate partner violence:    Fear of current or ex partner: Not on file    Emotionally abused: Not on file    Physically abused: Not on file    Forced sexual activity: Not on file  Other Topics Concern  . 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   No ankle edema. Lungs clear to auscultation. NSR . heart rate 66 bpm.   Facial rash - she attributed this to psoriasis, not lupus( not butterfly rash)  Scalp is without lesions. Joints not swollen.   Generalized: Well developed, in no acute distress, cooperative .  Neurological examination  Mentation: Alert oriented to time, place, history taking.  Montreal Cognitive Assessment  11/08/2017  Visuospatial/ Executive (0/5) 4  Naming (0/3) 3  Attention: Read list of digits (0/2) 2  Attention: Read list of letters (0/1) 1  Attention: Serial 7 subtraction starting at 100 (0/3) 3  Language: Repeat phrase (0/2) 2  Language : Fluency (0/1) 1  Abstraction (0/2) 2    Delayed Recall (0/5) 5  Orientation (0/6) 4  Total 27      Follows all commands, with a whispering speech. Her language is fluent. Cranial nerve: normal sense of taste and smell.   Pupils were equal round reactive to light. She has a history lazy eye on the right from a poison ivy episode age 4 . The left eye looks enophthalmic, sunken - the right is more protruding.   Extraocular movements were full, visual field were full on confrontational test. Facial sensation and strength were normal.  Uvula and tongue  were in midline. Head turning and shoulder shrug  were normal and symmetric.  Neck circumference 15.00 inches, Mallampati 4 +  Motor:  5 / 5 strength in all 4 extremities, symmetric motor tone is noted, no atrophy is seen throughout.  Sensory: soft touch intact on all 4 extremities.  Upper back burning tingling.  Coordination: intact finger-nose-fingerbilaterally.  Gait and station: Gait is normal based, but slowed. Tandem gait is affected . Romberg is negative. Normal arm swing.  Unable to stand on tip toes.  Reflexes: Deep tendon reflexes are symmetric bilaterally. No up going toe.   DIAGNOSTIC DATA (LABS, IMAGING, TESTING) - I reviewed patient records, labs, notes, testing and imaging myself where available.  Dr. Laurence Aly send off some additional laboratory tests last week of which I have not been able to see results.  I concentrated today on her cognitive dysfunction complaint, and I am happy that her Darrol Angel cognitive assessment returns normal.  Given her multitude of autoimmune conditions that may all be related to one another I think that a residual fatigue is always been there.  He ordered an advice systemic lupus erythematosus monitor test with HCQ.  Apparently this test will take about a week to return.  I will be most grateful if you would share the results with me.  I would like for the patient to take as little oxycodone as necessary and needed.  I think that narcotic  pain medication had in the past contributed to her central apnea and I would like her to be very sparingly using it.    Labs pending -  Will order Brain MRI - with and without contrast.     ASSESSMENT AND PLAN 57 y.o. year old female  has a past medical history of Facial nerve spasm (12/14/2012), Hypertension, Joint pain, Myalgia and myositis, Neuropathy, and Radicular pain of left lower extremity. here with:  1.  Subjective cognitive compliant. MOCA in normal range. 2. Chronic pain is not treated here , but her narcotic pan medications had contributed to complex, central sleep apnea in the past- she is now well controlled on CPAP.      We did not take time to address compliance to CPAP in the visit.   3. Chronic fatiguing conditions, autoimmune and pain conditions all contribute to fatigue. Will order Labs , and ask Dr.Beekman pending - - to rule out cerebritis.  4. Will order Brain MRI - with and without contrast- to rule out cerebritis. . EEG , cerebritis evaluation. Reports sudden lapses in memory and orientation, Zeitgitter-stoerungen.    She is advised that if her symptoms worsen or she  develops new symptoms she should let us know.  She will follow-up in 2-3 months with NP.   I spent 40 minutes with the patient. Well over 50% of this time was spent face to face in discussion and evaluation , explaining my diagnosis and steps.   Melvyn Novas, MD  11/08/2017, 11:29 AM [facility] [address] [place], [place] [address] [phone]

## 2017-11-08 NOTE — Addendum Note (Signed)
Addended by: Melvyn Novas on: 11/08/2017 12:09 PM   Modules accepted: Level of Service

## 2017-11-08 NOTE — Patient Instructions (Signed)
Cognitive Tips  Keep a journal/notebook with sections for the following (or use sections separately as needed):  Calendar and appointment sheet, schedule for each day, lists of reminders (such as grocery lists or "to do" list), homework assignments for therapy, important information such as family and friends names / addresses / phone numbers, medications, medical history and doctors name / phone numbers.  Avoid / remove clutter and unnecessary items from areas such as countertops / cabinets in kitchen and bathroom, closets, etc.  Organize items by purpose.  Baskets and bins help with this.  Leave notes for reminders above task to be completed.  For example: Note to turn off stove over the the stove; note to lock door beside the door, not to brush teeth then wash face by sink, note to take medication on table etc.  To help recall names of people of people or things, mentally or verbally go through the alphabet to try to determine the 1st letter of the word as this may trigger the name or word you are looking for.  If this is too difficult and someone else knows the word, have them give you the first letter by asking, "does it start with an "A", "B", "C" etc. (or have them give you the first sound of the word or some other clue).  Review family events, occasions, names, etc.  Pictures are a good way to trigger memory.  Have others correct you if you answer something incorrectly.  Have them speak slowly with a few words to give you time to process and respond.  Don't let others automatically problem-solve. (For example: don't let them automatically lay out clothes in the correct position, but hand it to you folded so that you can figure it out for yourself.)  However, if you need help with tasks, they should give you as little as they can so that you can be successful.  If appropriate and safe, they may allow you to make mistakes so that you can figure out how to correct the error.  (For example, they  may allow you to put your shoes on the wrong foot to see if you notice that is wrong).  If you struggle, they should give you a cue.  (Example: "Do your shoes feel right?"  "Do they look right?") 

## 2017-11-09 ENCOUNTER — Telehealth: Payer: Self-pay

## 2017-11-09 ENCOUNTER — Telehealth: Payer: Self-pay | Admitting: Neurology

## 2017-11-09 ENCOUNTER — Ambulatory Visit: Payer: 59 | Admitting: Neurology

## 2017-11-09 DIAGNOSIS — R4189 Other symptoms and signs involving cognitive functions and awareness: Secondary | ICD-10-CM

## 2017-11-09 DIAGNOSIS — M3219 Other organ or system involvement in systemic lupus erythematosus: Secondary | ICD-10-CM

## 2017-11-09 DIAGNOSIS — R410 Disorientation, unspecified: Secondary | ICD-10-CM | POA: Diagnosis not present

## 2017-11-09 DIAGNOSIS — R413 Other amnesia: Secondary | ICD-10-CM

## 2017-11-09 LAB — COMPREHENSIVE METABOLIC PANEL
ALBUMIN: 4.5 g/dL (ref 3.5–5.5)
ALK PHOS: 45 IU/L (ref 39–117)
ALT: 21 IU/L (ref 0–32)
AST: 25 IU/L (ref 0–40)
Albumin/Globulin Ratio: 2 (ref 1.2–2.2)
BILIRUBIN TOTAL: 0.5 mg/dL (ref 0.0–1.2)
BUN / CREAT RATIO: 20 (ref 9–23)
BUN: 17 mg/dL (ref 6–24)
CHLORIDE: 101 mmol/L (ref 96–106)
CO2: 23 mmol/L (ref 20–29)
Calcium: 9.9 mg/dL (ref 8.7–10.2)
Creatinine, Ser: 0.83 mg/dL (ref 0.57–1.00)
GFR calc Af Amer: 89 mL/min/{1.73_m2} (ref >59)
GFR calc non Af Amer: 77 mL/min/{1.73_m2} (ref >59)
GLUCOSE: 79 mg/dL (ref 65–99)
Globulin, Total: 2.2 g/dL (ref 1.5–4.5)
Potassium: 4.3 mmol/L (ref 3.5–5.2)
Sodium: 143 mmol/L (ref 134–144)
Total Protein: 6.7 g/dL (ref 6.0–8.5)

## 2017-11-09 NOTE — Telephone Encounter (Signed)
left message for pt to call me back about scheduling mri  [facility] Auth: [id number] (exp. 11/08/17 to 12/23/17)

## 2017-11-09 NOTE — Telephone Encounter (Signed)
Patient is returning your call.  

## 2017-11-09 NOTE — Telephone Encounter (Signed)
-----   Message from Geronimo Running, RN sent at 11/09/2017  9:12 AM EDT -----   ----- Message ----- From: Melvyn Novas, MD Sent: 11/09/2017   8:41 AM EDT To: Judi Cong, RN  Normal metabolic panel. Unimpaired kidney and liver function. CD

## 2017-11-09 NOTE — Telephone Encounter (Signed)
MR Brain w/wo contrast Dr. Vickey Huger [facility] Auth: [id number] (exp. 11/08/17 to 12/23/17). Patient is scheduled at [facility] for 11/10/17

## 2017-11-09 NOTE — Telephone Encounter (Signed)
MR Brain w/wo contrast Dr. Dohmeier [employer] Auth: [id number] (exp. 11/08/17 to 12/23/17). Patient is scheduled at [facility] for 11/10/17  °

## 2017-11-09 NOTE — Telephone Encounter (Signed)
I called home phone and left a detailed message with normal lab results, ok per dpr.

## 2017-11-10 ENCOUNTER — Ambulatory Visit: Payer: 59

## 2017-11-10 DIAGNOSIS — M329 Systemic lupus erythematosus, unspecified: Secondary | ICD-10-CM | POA: Diagnosis not present

## 2017-11-10 MED ORDER — GADOBENATE DIMEGLUMINE 529 MG/ML IV SOLN
20.0000 mL | Freq: Once | INTRAVENOUS | Status: AC | PRN
  Administered 2017-11-10: 20 mL via INTRAVENOUS

## 2017-11-16 ENCOUNTER — Telehealth: Payer: Self-pay | Admitting: Neurology

## 2017-11-16 DIAGNOSIS — G4731 Primary central sleep apnea: Secondary | ICD-10-CM

## 2017-11-16 DIAGNOSIS — M329 Systemic lupus erythematosus, unspecified: Secondary | ICD-10-CM | POA: Insufficient documentation

## 2017-11-16 DIAGNOSIS — R4189 Other symptoms and signs involving cognitive functions and awareness: Secondary | ICD-10-CM

## 2017-11-16 NOTE — Addendum Note (Signed)
Addended by: Melvyn Novas on: 11/16/2017 04:11 PM   Modules accepted: Orders

## 2017-11-16 NOTE — Procedures (Signed)
EEG repeat dictation, 11-10-2017,  This EEG recording was performed in an alert and cooperative patient, Mrs. [name]. [name], a 57 year old Caucasian female.  This is a prolonged EEG recording with 45 minutes duration.    A posterior dominant rhythm was established under eye closure at 9-10 Hz posterior dominance, intermittently seen 10 Hz activity. There is excessive beta fast act activity noted over the frontal polar regions, which can be a medication induced abnormality, slow rolling eye movements are noted and to my surprise these continue under hyperventilation.  Hyperventilation did increase the amplitude of the EEG recording, no clearly seen at 9 Hz and persistent up to 2 minutes after hyperventilation was concluded.  During photic stimulation there is photic entrainment noticed best seen at 9 Hz which corresponds with the posterior dominant background, there is no longer photic entrainment noted after 18 Hz are reached.   These maneuvers are followed by a period of rest during which the patient seems to enter drowsiness and  N -1 sleep, the posterior posterior dominant rhythm is now as low as 5 Hz, and there are rare, occasional K complexes noted.   Sleep continues for several minutes until the end of the study, but the EKG remains in normal sinus rhythm.  This is a normal EEG for the patient's age and conscious state.

## 2017-11-16 NOTE — Telephone Encounter (Signed)
Will order CSF testing at [facility] imaging, fluroscopy.

## 2017-11-16 NOTE — Telephone Encounter (Signed)
Called the patient and advised her that MRI results were normal and Dr Vickey Huger didn't see anything of concern on the MRI. Her EEG results have not been read yet but I advised the patient that we would call her once I have those results. Advised the patient that Dr Vickey Huger would recommend the pt having her CSF fluid tested by completing a lumbar puncture. The patient agreed that she would like to complete that testing. I informed her that Dr Vickey Huger will order the test and for her to be on the lookout for a call from [facility] imaging to get her scheduled to complete the test. Pt verbalized understanding. Pt had no questions at this time but was encouraged to call back if questions arise.

## 2017-11-16 NOTE — Telephone Encounter (Signed)
-----   Message from Melvyn Novas, MD sent at 11/12/2017 10:56 AM EDT ----- No CNS manifestations of SLE, no evidence of cerebritis.  I recommend to follow up with CSF testing.  Cc PA Arlyce Dice

## 2017-11-18 ENCOUNTER — Telehealth: Payer: Self-pay | Admitting: Neurology

## 2017-11-18 NOTE — Telephone Encounter (Signed)
Called the pt to advise her of the EEG results that were normal. Informed her of the rapid eye movement that was present during the study but assured there was no correlation with abnormal activity on the EEG test. Pt is scheduled end of the month for the LP and advised the patient I will call once I have all those results. Pt verbalized understanding and was appreciative for the call.

## 2017-11-18 NOTE — Telephone Encounter (Signed)
-----   Message from Melvyn Novas, MD sent at 11/16/2017  5:56 PM EDT ----- Normal EEG, but notable excessive eye movements - medication induced?

## 2017-12-01 ENCOUNTER — Other Ambulatory Visit (HOSPITAL_COMMUNITY)
Admission: RE | Admit: 2017-12-01 | Discharge: 2017-12-01 | Disposition: A | Discharge status: Home / Self Care | Payer: 59 | Source: Ambulatory Visit | Attending: Neurology | Admitting: Neurology

## 2017-12-01 ENCOUNTER — Ambulatory Visit
Admission: RE | Admit: 2017-12-01 | Discharge: 2017-12-01 | Disposition: A | Discharge status: Home / Self Care | Payer: 59 | Source: Ambulatory Visit | Attending: Neurology | Admitting: Neurology

## 2017-12-01 ENCOUNTER — Telehealth: Payer: Self-pay | Admitting: *Deleted

## 2017-12-01 VITALS — BP 106/62 | HR 66

## 2017-12-01 DIAGNOSIS — R4189 Other symptoms and signs involving cognitive functions and awareness: Secondary | ICD-10-CM | POA: Diagnosis present

## 2017-12-01 DIAGNOSIS — M3219 Other organ or system involvement in systemic lupus erythematosus: Secondary | ICD-10-CM

## 2017-12-01 DIAGNOSIS — G4731 Primary central sleep apnea: Secondary | ICD-10-CM

## 2017-12-01 DIAGNOSIS — M329 Systemic lupus erythematosus, unspecified: Secondary | ICD-10-CM

## 2017-12-01 NOTE — Telephone Encounter (Signed)
-----   Message from Melvyn Novas, MD sent at 12/01/2017  1:23 PM EDT ----- successful LP- waiting for CBC and chemistry, opening pressure.

## 2017-12-01 NOTE — Progress Notes (Signed)
Blood obtained from pt's L AC for LP labs.

## 2017-12-01 NOTE — Telephone Encounter (Signed)
I spoke to pt and relayed that we have received some of the CSF results back (gram stain no wbc's, protein, glucose which are in normal reference range.  Awaiting others and will call back as they result.  She verbalized understanding.

## 2017-12-01 NOTE — Discharge Instructions (Signed)

## 2017-12-07 ENCOUNTER — Telehealth: Payer: Self-pay | Admitting: Neurology

## 2017-12-07 DIAGNOSIS — R413 Other amnesia: Secondary | ICD-10-CM

## 2017-12-07 DIAGNOSIS — M545 Low back pain, unspecified: Secondary | ICD-10-CM | POA: Insufficient documentation

## 2017-12-07 DIAGNOSIS — M329 Systemic lupus erythematosus, unspecified: Secondary | ICD-10-CM

## 2017-12-07 DIAGNOSIS — G4731 Primary central sleep apnea: Secondary | ICD-10-CM

## 2017-12-07 NOTE — Telephone Encounter (Signed)
-----   Message from Melvyn Novas, MD sent at 12/06/2017  9:01 AM EDT ----- Normal protein, Glucose and negative for oligoclonal bands, ACE, lyme.

## 2017-12-07 NOTE — Telephone Encounter (Signed)
Yes, I think it's good to get a baseline. This will help to recognize ( possibly) any  trend in the future .  Dr. Dimas Chyle is often booked up for month- any alternative provider for testing ?

## 2017-12-07 NOTE — Addendum Note (Signed)
Addended by: Melvyn Novas on: 12/07/2017 10:37 AM   Modules accepted: Orders

## 2017-12-07 NOTE — Telephone Encounter (Signed)
Called the patient and advised her the LP was sucessful and the lab work appeared to all be WNL. Advised there were no oligoclonal bands found. Patient was appreciative for the results and has asked to inquire with Dr Dohmeier on whether she thinks neuropsychological evaluation should be completed. Since MRI and LP are WNL she just wanted to know if Dr Vickey Huger thinks that is worth checking into or even needed to assess cognitive problems.  I informed her I would send her questions to Dr Vickey Huger and let her know what she thinks. Pt verbalized understanding and was appreciative for the results.

## 2017-12-07 NOTE — Telephone Encounter (Signed)
I called patient and told her we are sending her to Dr. Kieth Brightly.

## 2018-01-03 ENCOUNTER — Other Ambulatory Visit: Payer: Self-pay | Admitting: Family Medicine

## 2018-01-03 DIAGNOSIS — Z1231 Encounter for screening mammogram for malignant neoplasm of breast: Secondary | ICD-10-CM

## 2018-01-20 ENCOUNTER — Ambulatory Visit: Payer: 59 | Admitting: Adult Health

## 2018-01-25 DIAGNOSIS — K754 Autoimmune hepatitis: Secondary | ICD-10-CM | POA: Insufficient documentation

## 2018-01-25 DIAGNOSIS — K580 Irritable bowel syndrome with diarrhea: Secondary | ICD-10-CM | POA: Insufficient documentation

## 2018-01-25 DIAGNOSIS — N390 Urinary tract infection, site not specified: Secondary | ICD-10-CM | POA: Insufficient documentation

## 2018-02-04 LAB — PROTEIN, CSF: Total Protein, CSF: 38 mg/dL (ref 15–45)

## 2018-02-04 LAB — LYME DISEASE ABS IGG, IGM, IFA, CSF
Lyme Disease AB (IgG), IBL: NOT DETECTED
Lyme Disease AB (IgM), IBL: NOT DETECTED

## 2018-02-04 LAB — GRAM STAIN
MICRO NUMBER:: 91274639
SPECIMEN QUALITY: ADEQUATE

## 2018-02-04 LAB — CSF CELL COUNT WITH DIFFERENTIAL
RBC COUNT CSF: 0 {cells}/uL (ref 0–10)
WBC CSF: 2 {cells}/uL (ref 0–5)

## 2018-02-04 LAB — ANGIOTENSIN CONVERTING ENZYME, CSF: ACE, CSF: 4 U/L (ref <=15)

## 2018-02-04 LAB — GLUCOSE, CSF: Glucose, CSF: 62 mg/dL (ref 40–80)

## 2018-02-04 LAB — OLIGOCLONAL BANDS, CSF + SERM

## 2018-02-15 DIAGNOSIS — M47812 Spondylosis without myelopathy or radiculopathy, cervical region: Secondary | ICD-10-CM

## 2018-02-15 HISTORY — DX: Spondylosis without myelopathy or radiculopathy, cervical region: M47.812

## 2018-03-07 ENCOUNTER — Ambulatory Visit: Payer: 59

## 2018-03-07 ENCOUNTER — Ambulatory Visit
Admission: RE | Admit: 2018-03-07 | Discharge: 2018-03-07 | Disposition: A | Discharge status: Home / Self Care | Payer: 59 | Source: Ambulatory Visit | Attending: Family Medicine | Admitting: Family Medicine

## 2018-03-07 DIAGNOSIS — Z1231 Encounter for screening mammogram for malignant neoplasm of breast: Secondary | ICD-10-CM

## 2018-03-09 ENCOUNTER — Other Ambulatory Visit: Payer: Self-pay | Admitting: Family Medicine

## 2018-03-09 DIAGNOSIS — R928 Other abnormal and inconclusive findings on diagnostic imaging of breast: Secondary | ICD-10-CM

## 2018-03-11 ENCOUNTER — Ambulatory Visit
Admission: RE | Admit: 2018-03-11 | Discharge: 2018-03-11 | Disposition: A | Discharge status: Home / Self Care | Payer: 59 | Source: Ambulatory Visit | Attending: Family Medicine | Admitting: Family Medicine

## 2018-03-11 ENCOUNTER — Ambulatory Visit: Payer: 59

## 2018-03-11 DIAGNOSIS — R928 Other abnormal and inconclusive findings on diagnostic imaging of breast: Secondary | ICD-10-CM

## 2018-06-29 DIAGNOSIS — Z0271 Encounter for disability determination: Secondary | ICD-10-CM

## 2018-07-20 DIAGNOSIS — M5030 Other cervical disc degeneration, unspecified cervical region: Secondary | ICD-10-CM | POA: Insufficient documentation

## 2018-10-03 ENCOUNTER — Other Ambulatory Visit: Payer: Self-pay | Admitting: Orthopaedic Surgery

## 2018-10-03 DIAGNOSIS — M5416 Radiculopathy, lumbar region: Secondary | ICD-10-CM

## 2018-10-10 ENCOUNTER — Other Ambulatory Visit: Payer: Self-pay

## 2018-10-10 ENCOUNTER — Ambulatory Visit
Admission: RE | Admit: 2018-10-10 | Discharge: 2018-10-10 | Disposition: A | Discharge status: Home / Self Care | Payer: BC Managed Care – PPO | Source: Ambulatory Visit | Attending: Orthopaedic Surgery | Admitting: Orthopaedic Surgery

## 2018-10-10 DIAGNOSIS — M5416 Radiculopathy, lumbar region: Secondary | ICD-10-CM

## 2018-11-28 DIAGNOSIS — M4726 Other spondylosis with radiculopathy, lumbar region: Secondary | ICD-10-CM | POA: Insufficient documentation

## 2018-12-01 DIAGNOSIS — Z7409 Other reduced mobility: Secondary | ICD-10-CM | POA: Insufficient documentation

## 2018-12-01 DIAGNOSIS — Z789 Other specified health status: Secondary | ICD-10-CM | POA: Insufficient documentation

## 2018-12-01 DIAGNOSIS — M48061 Spinal stenosis, lumbar region without neurogenic claudication: Secondary | ICD-10-CM

## 2018-12-01 DIAGNOSIS — M5416 Radiculopathy, lumbar region: Secondary | ICD-10-CM | POA: Insufficient documentation

## 2018-12-01 HISTORY — DX: Spinal stenosis, lumbar region without neurogenic claudication: M48.061

## 2019-01-12 ENCOUNTER — Ambulatory Visit: Admit: 2019-01-12 | Payer: BC Managed Care – PPO | Admitting: Orthopedic Surgery

## 2019-01-12 SURGERY — ARTHROPLASTY, KNEE, UNICOMPARTMENTAL
Anesthesia: Spinal | Site: Knee | Laterality: Left

## 2019-02-14 ENCOUNTER — Other Ambulatory Visit: Payer: Self-pay | Admitting: Family Medicine

## 2019-02-14 DIAGNOSIS — Z1231 Encounter for screening mammogram for malignant neoplasm of breast: Secondary | ICD-10-CM

## 2019-03-27 ENCOUNTER — Ambulatory Visit
Admission: RE | Admit: 2019-03-27 | Discharge: 2019-03-27 | Disposition: A | Discharge status: Home / Self Care | Payer: 59 | Source: Ambulatory Visit | Attending: Family Medicine | Admitting: Family Medicine

## 2019-03-27 ENCOUNTER — Other Ambulatory Visit: Payer: Self-pay

## 2019-03-27 DIAGNOSIS — Z1231 Encounter for screening mammogram for malignant neoplasm of breast: Secondary | ICD-10-CM

## 2019-03-28 ENCOUNTER — Other Ambulatory Visit: Payer: Self-pay | Admitting: Family Medicine

## 2019-03-28 DIAGNOSIS — R928 Other abnormal and inconclusive findings on diagnostic imaging of breast: Secondary | ICD-10-CM

## 2019-04-07 ENCOUNTER — Ambulatory Visit: Payer: 59

## 2019-04-07 ENCOUNTER — Ambulatory Visit
Admission: RE | Admit: 2019-04-07 | Discharge: 2019-04-07 | Disposition: A | Discharge status: Home / Self Care | Payer: 59 | Source: Ambulatory Visit | Attending: Family Medicine | Admitting: Family Medicine

## 2019-04-07 ENCOUNTER — Other Ambulatory Visit: Payer: Self-pay

## 2019-04-07 DIAGNOSIS — R928 Other abnormal and inconclusive findings on diagnostic imaging of breast: Secondary | ICD-10-CM

## 2019-08-01 ENCOUNTER — Ambulatory Visit (INDEPENDENT_AMBULATORY_CARE_PROVIDER_SITE_OTHER): Payer: 59 | Admitting: Family Medicine

## 2019-08-01 ENCOUNTER — Other Ambulatory Visit: Payer: Self-pay

## 2019-08-01 ENCOUNTER — Encounter (INDEPENDENT_AMBULATORY_CARE_PROVIDER_SITE_OTHER): Payer: Self-pay | Admitting: Family Medicine

## 2019-08-01 VITALS — BP 130/85 | HR 73 | Temp 98.4°F | Ht 60.0 in | Wt 227.0 lb

## 2019-08-01 DIAGNOSIS — R0602 Shortness of breath: Secondary | ICD-10-CM

## 2019-08-01 DIAGNOSIS — I10 Essential (primary) hypertension: Secondary | ICD-10-CM

## 2019-08-01 DIAGNOSIS — R5383 Other fatigue: Secondary | ICD-10-CM

## 2019-08-01 DIAGNOSIS — F3289 Other specified depressive episodes: Secondary | ICD-10-CM

## 2019-08-01 DIAGNOSIS — K754 Autoimmune hepatitis: Secondary | ICD-10-CM

## 2019-08-01 DIAGNOSIS — E559 Vitamin D deficiency, unspecified: Secondary | ICD-10-CM

## 2019-08-01 DIAGNOSIS — Z9989 Dependence on other enabling machines and devices: Secondary | ICD-10-CM

## 2019-08-01 DIAGNOSIS — M329 Systemic lupus erythematosus, unspecified: Secondary | ICD-10-CM

## 2019-08-01 DIAGNOSIS — G4700 Insomnia, unspecified: Secondary | ICD-10-CM

## 2019-08-01 DIAGNOSIS — Z6841 Body Mass Index (BMI) 40.0 and over, adult: Secondary | ICD-10-CM

## 2019-08-01 DIAGNOSIS — Z9189 Other specified personal risk factors, not elsewhere classified: Secondary | ICD-10-CM | POA: Diagnosis not present

## 2019-08-01 DIAGNOSIS — E785 Hyperlipidemia, unspecified: Secondary | ICD-10-CM | POA: Diagnosis not present

## 2019-08-01 DIAGNOSIS — G8929 Other chronic pain: Secondary | ICD-10-CM

## 2019-08-01 DIAGNOSIS — G4733 Obstructive sleep apnea (adult) (pediatric): Secondary | ICD-10-CM

## 2019-08-01 DIAGNOSIS — Z0289 Encounter for other administrative examinations: Secondary | ICD-10-CM

## 2019-08-01 NOTE — Progress Notes (Signed)
Chief Complaint:   OBESITY [name] (MR# [id number]) is a 59 y.o. female who presents for evaluation and treatment of obesity and related comorbidities. Current BMI is Body mass index is 44.33 kg/m. Chatara has been struggling with her weight for many years and has been unsuccessful in either losing weight, maintaining weight loss, or reaching her healthy weight goal.  Sareen is currently in the action stage of change and ready to dedicate time achieving and maintaining a healthier weight. Ezrah is interested in becoming our patient and working on intensive lifestyle modifications including (but not limited to) diet and exercise for weight loss.  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  She lives with her husband.  She will be doing the swimming portion of a triathlon in a few weeks.  She endorses polyphagia.  She will be retiring from her job on Jun 24, 2020.  Shawnice's habits were reviewed today and are as follows: Her family eats meals together, she thinks her family will eat healthier with her, her desired weight loss is 100 pounds, she started gaining weight after the birth of her third child, her heaviest weight ever was 228 pounds, she craves chocolate, beef, and salt, she snacks frequently in the evenings, she wakes up frequently in the middle of the night to eat, she skips breakfast frequently, she is frequently drinking liquids with calories, she frequently makes poor food choices, she has problems with excessive hunger, she frequently eats larger portions than normal and she struggles with emotional eating.  Depression Screen Kalynne's Food and Mood (modified PHQ-9) score was 17.  Depression screen [facility] 2/9 08/01/2019  Decreased Interest 3  Down, Depressed, Hopeless 3  PHQ - 2 Score 6  Altered sleeping 3  Tired, decreased energy 3  Change in appetite 3  Feeling bad or failure about yourself  1  Trouble concentrating 1  Suicidal thoughts 0  PHQ-9 Score 17    Difficult doing work/chores Not difficult at all   Subjective:   1. Other fatigue Cenia denies daytime somnolence and admits to waking up still tired. Kelsay generally gets 6 or 7 hours of sleep per night, and states that she has poor quality sleep. Snoring is not present. Apneic episodes are not present. Epworth Sleepiness Score is 7.  Grisela has sleep apnea and uses a CPAP machine.  2. SOB (shortness of breath) on exertion Corrie Dandy notes increasing shortness of breath with exercising and seems to be worsening over time with weight gain. She notes getting out of breath sooner with activity than she used to. This has gotten worse recently. Rayan denies shortness of breath at rest or orthopnea.  3. Essential hypertension Review: taking medications as instructed, no medication side effects noted, no chest pain on exertion, no dyspnea on exertion, no swelling of ankles.  She is taking lisinopril-HCTZ for blood pressure control.  BP Readings from Last 3 Encounters:  08/01/19 130/85  12/01/17 106/62  11/08/17 123/82   4. Hyperlipidemia Aubriella has hyperlipidemia and has been trying to improve her cholesterol levels with intensive lifestyle modification including a low saturated fat diet, exercise and weight loss. She denies any chest pain, claudication or myalgias.  She is taking atorvastatin.  Lab Results  Component Value Date   ALT 21 11/08/2017   AST 25 11/08/2017   ALKPHOS 45 11/08/2017   BILITOT 0.5 11/08/2017   5. Lupus [facility]) Khole is followed by Dr. Dierdre Forth in Rheumatology.  6. Other  chronic pain With lupus, psoriatic arthritis, chronic UTIs, IBS, and fibromyalgia.  Left knee may require TKR.  She is taking several obesogenic medications, including hydroxychlorquine, oxycodone, prednisone, and gabapentin.  She sees Dr. Nelva Bush for pain management.  7. Vitamin D deficiency She is currently taking no vitamin D supplement   8. Insomnia Florabel has difficulty sleeping and takes Ambien.  She is  working on weaning from this.  9. Autoimmune hepatitis ([place]), fatty liver She is followed by Dr. Michail Sermon for this.    10. OSA on CPAP Shaneca has a diagnosis of sleep apnea. She reports that she is using a CPAP regularly.   11. Other depression, with emotional eating Kamren is struggling with emotional eating and using food for comfort to the extent that it is negatively impacting her health. She has been working on behavior modification techniques to help reduce her emotional eating and has been unsuccessful. She shows no sign of suicidal or homicidal ideations.  She is followed by Dr. Daron Offer.  She is taking Pristiq and Rexulti..  PHQ-9 is 17 today.  Assessment/Plan:   1. Other fatigue Aslee does feel that her weight is causing her energy to be lower than it should be. Fatigue may be related to obesity, depression or many other causes. Labs will be ordered, and in the meanwhile, Katlen will focus on self care including making healthy food choices, increasing physical activity and focusing on stress reduction.  Orders - EKG 12-Lead - Hemoglobin A1c - Insulin, random - Anemia panel  2. SOB (shortness of breath) on exertion Bethan does feel that she gets out of breath more easily that she used to when she exercises. Jacques's shortness of breath appears to be obesity related and exercise induced. She has agreed to work on weight loss and gradually increase exercise to treat her exercise induced shortness of breath. Will continue to monitor closely.  3. Essential hypertension Cecilia is working on healthy weight loss and exercise to improve blood pressure control. We will watch for signs of hypotension as she continues her lifestyle modifications.  4. Hyperlipidemia, unspecified hyperlipidemia type Cardiovascular risk and specific lipid/LDL goals reviewed.  We discussed several lifestyle modifications today and Aracelys will continue to work on diet, exercise and weight loss efforts. Orders and follow up as  documented in patient record.   Counseling Intensive lifestyle modifications are the first line treatment for this issue. . Dietary changes: Increase soluble fiber. Decrease simple carbohydrates. . Exercise changes: Moderate to vigorous-intensity aerobic activity 150 minutes per week if tolerated. . Lipid-lowering medications: see documented in medical record.  Orders - Lipid panel  5. Lupus ([place]) Followed by Rheumatology for this problem. Those encounter notes were reviewed.  Orders - TSH - T4, free - T3  6. Other chronic pain Will continue to follow along. This issue directly impacts care plan for optimization of BMI and metabolic health as it impacts the patient's ability to make lifestyle changes.  7. Vitamin D deficiency Low Vitamin D level contributes to fatigue and are associated with obesity, breast, and colon cancer.  Will check vitamin D level today, as per below.  Orders - VITAMIN D 25 Hydroxy (Vit-D Deficiency, Fractures)  8. Insomnia, unspecified type The problem of recurrent insomnia was discussed. Orders and follow up as documented in patient record. Counseling: Intensive lifestyle modifications are the first line treatment for this issue. We discussed several lifestyle modifications today and she will continue to work on diet, exercise and weight loss efforts.   Counseling  Limit or avoid alcohol, caffeinated beverages, and cigarettes, especially close to bedtime.   Do not eat a large meal or eat spicy foods right before bedtime. This can lead to digestive discomfort that can make it hard for you to sleep.  Keep a sleep diary to help you and your health care provider figure out what could be causing your insomnia.  . Make your bedroom a dark, comfortable place where it is easy to fall asleep. ? Put up shades or blackout curtains to block light from outside. ? Use a white noise machine to block noise. ? Keep the temperature cool. . Limit screen use before  bedtime. This includes: ? Watching TV. ? Using your smartphone, tablet, or computer. . Stick to a routine that includes going to bed and waking up at the same times every day and night. This can help you fall asleep faster. Consider making a quiet activity, such as reading, part of your nighttime routine. . Try to avoid taking naps during the day so that you sleep better at night. . Get out of bed if you are still awake after 15 minutes of trying to sleep. Keep the lights down, but try reading or doing a quiet activity. When you feel sleepy, go back to bed.  9. Autoimmune hepatitis (HCC) Lab results reviewed with patient. We will continue to monitor.  Orders - Comprehensive metabolic panel - CBC with Differential/Platelet  10. OSA on CPAP Intensive lifestyle modifications are the first line treatment for this issue. We discussed several lifestyle modifications today and she will continue to work on diet, exercise and weight loss efforts. We will continue to monitor. Orders and follow up as documented in patient record.   Counseling  Sleep apnea is a condition in which breathing pauses or becomes shallow during sleep. This happens over and over during the night. This disrupts your sleep and keeps your body from getting the rest that it needs, which can cause tiredness and lack of energy (fatigue) during the day.  Sleep apnea treatment: If you were given a device to open your airway while you sleep, USE IT!  Sleep hygiene:   Limit or avoid alcohol, caffeinated beverages, and cigarettes, especially close to bedtime.   Do not eat a large meal or eat spicy foods right before bedtime. This can lead to digestive discomfort that can make it hard for you to sleep.  Keep a sleep diary to help you and your health care provider figure out what could be causing your insomnia.  . Make your bedroom a dark, comfortable place where it is easy to fall asleep. ? Put up shades or blackout curtains to  block light from outside. ? Use a white noise machine to block noise. ? Keep the temperature cool. . Limit screen use before bedtime. This includes: ? Watching TV. ? Using your smartphone, tablet, or computer. . Stick to a routine that includes going to bed and waking up at the same times every day and night. This can help you fall asleep faster. Consider making a quiet activity, such as reading, part of your nighttime routine. . Try to avoid taking naps during the day so that you sleep better at night. . Get out of bed if you are still awake after 15 minutes of trying to sleep. Keep the lights down, but try reading or doing a quiet activity. When you feel sleepy, go back to bed.  11. Other depression, with emotional eating Behavior modification techniques were discussed today  to help Melvine deal with her emotional/non-hunger eating behaviors.  Orders and follow up as documented in patient record.   12. At risk for heart disease Gwynn was given approximately 15 minutes of coronary artery disease prevention counseling today. She is 59 y.o. female and has risk factors for heart disease including obesity. We discussed intensive lifestyle modifications today with an emphasis on specific weight loss instructions and strategies.   Repetitive spaced learning was employed today to elicit superior memory formation and behavioral change.  13. Class 3 severe obesity with serious comorbidity and body mass index (BMI) of 40.0 to 44.9 in adult, unspecified obesity type [facility]) Tekeshia is currently in the action stage of change and her goal is to continue with weight loss efforts. I recommend Kameah begin the structured treatment plan as follows:  She has agreed to the Category 2 Plan +400 calories.  Exercise goals: As is.   Behavioral modification strategies: increasing lean protein intake, decreasing simple carbohydrates, increasing vegetables and increasing water intake.  She was informed of the importance of  frequent follow-up visits to maximize her success with intensive lifestyle modifications for her multiple health conditions. She was informed we would discuss her lab results at her next visit unless there is a critical issue that needs to be addressed sooner. Flo agreed to keep her next visit at the agreed upon time to discuss these results.  Objective:   Blood pressure 130/85, pulse 73, temperature 98.4 F (36.9 C), temperature source Oral, height 5' (1.524 m), weight 227 lb (103 kg), SpO2 96 %. Body mass index is 44.33 kg/m.  EKG: Normal sinus rhythm, rate 74 bpm.  Indirect Calorimeter completed today shows a VO2 of 266 and a REE of 1848.  Her calculated basal metabolic rate is 0539 thus her basal metabolic rate is better than expected.  General: Cooperative, alert, well developed, in no acute distress. HEENT: Conjunctivae and lids unremarkable. Cardiovascular: Regular rhythm.  Lungs: Normal work of breathing. Neurologic: No focal deficits.   Lab Results  Component Value Date   CREATININE 0.83 11/08/2017   BUN 17 11/08/2017   NA 143 11/08/2017   K 4.3 11/08/2017   CL 101 11/08/2017   CO2 23 11/08/2017   Lab Results  Component Value Date   ALT 21 11/08/2017   AST 25 11/08/2017   ALKPHOS 45 11/08/2017   BILITOT 0.5 11/08/2017   Lab Results  Component Value Date   WBC 5.4 04/02/2016   HGB 13.4 04/02/2016   HCT 40.2 04/02/2016   MCV 92.6 04/02/2016   PLT 252 04/02/2016   Attestation Statements:   This is the patient's first visit at [facility]. The patient's NEW PATIENT PACKET was reviewed at length. Included in the packet: current and past health history, medications, allergies, ROS, gynecologic history (women only), surgical history, family history, social history, weight history, weight loss surgery history (for those that have had weight loss surgery), nutritional evaluation, mood and food questionnaire, PHQ9, Epworth questionnaire, sleep habits  questionnaire, patient life and health improvement goals questionnaire. These will all be scanned into the patient's chart under media.   During the visit, I independently reviewed the patient's EKG, bioimpedance scale results, and indirect calorimeter results. I used this information to tailor a meal plan for the patient that will help her to lose weight and will improve her obesity-related conditions going forward. I performed a medically necessary appropriate examination and/or evaluation. I discussed the assessment and treatment plan with the patient. The patient  was provided an opportunity to ask questions and all were answered. The patient agreed with the plan and demonstrated an understanding of the instructions. Labs were ordered at this visit and will be reviewed at the next visit unless more critical results need to be addressed immediately. Clinical information was updated and documented in the EMR.   I, Insurance claims handler, CMA, am acting as transcriptionist for Helane Rima, DO  I have reviewed the above documentation for accuracy and completeness, and I agree with the above. Helane Rima, DO

## 2019-08-02 ENCOUNTER — Encounter (INDEPENDENT_AMBULATORY_CARE_PROVIDER_SITE_OTHER): Payer: Self-pay | Admitting: Family Medicine

## 2019-08-02 LAB — CBC WITH DIFFERENTIAL/PLATELET
Basophils Absolute: 0 10*3/uL (ref 0.0–0.2)
Basos: 1 %
EOS (ABSOLUTE): 0.1 10*3/uL (ref 0.0–0.4)
Eos: 1 %
Hemoglobin: 14.1 g/dL (ref 11.1–15.9)
Immature Grans (Abs): 0 10*3/uL (ref 0.0–0.1)
Immature Granulocytes: 0 %
Lymphocytes Absolute: 2.1 10*3/uL (ref 0.7–3.1)
Lymphs: 30 %
MCH: 30.2 pg (ref 26.6–33.0)
MCHC: 32.9 g/dL (ref 31.5–35.7)
MCV: 92 fL (ref 79–97)
Monocytes Absolute: 0.6 10*3/uL (ref 0.1–0.9)
Monocytes: 9 %
Neutrophils Absolute: 4.1 10*3/uL (ref 1.4–7.0)
Neutrophils: 59 %
Platelets: 233 10*3/uL (ref 150–450)
RBC: 4.67 x10E6/uL (ref 3.77–5.28)
RDW: 13.7 % (ref 11.7–15.4)
WBC: 6.9 10*3/uL (ref 3.4–10.8)

## 2019-08-02 LAB — COMPREHENSIVE METABOLIC PANEL
ALT: 32 IU/L (ref 0–32)
AST: 34 IU/L (ref 0–40)
Albumin/Globulin Ratio: 1.7 (ref 1.2–2.2)
Albumin: 4.2 g/dL (ref 3.8–4.8)
Alkaline Phosphatase: 98 IU/L (ref 48–121)
BUN/Creatinine Ratio: 19 (ref 12–28)
BUN: 14 mg/dL (ref 8–27)
Bilirubin Total: 0.3 mg/dL (ref 0.0–1.2)
CO2: 26 mmol/L (ref 20–29)
Calcium: 9.5 mg/dL (ref 8.7–10.3)
Chloride: 103 mmol/L (ref 96–106)
Creatinine, Ser: 0.73 mg/dL (ref 0.57–1.00)
GFR calc Af Amer: 103 mL/min/{1.73_m2} (ref >59)
GFR calc non Af Amer: 89 mL/min/{1.73_m2} (ref >59)
Globulin, Total: 2.5 g/dL (ref 1.5–4.5)
Glucose: 81 mg/dL (ref 65–99)
Potassium: 4 mmol/L (ref 3.5–5.2)
Sodium: 143 mmol/L (ref 134–144)
Total Protein: 6.7 g/dL (ref 6.0–8.5)

## 2019-08-02 LAB — VITAMIN D 25 HYDROXY (VIT D DEFICIENCY, FRACTURES): Vit D, 25-Hydroxy: 37.4 ng/mL (ref 30.0–100.0)

## 2019-08-02 LAB — TSH: TSH: 1.95 u[IU]/mL (ref 0.450–4.500)

## 2019-08-02 LAB — LIPID PANEL
Chol/HDL Ratio: 2.5 ratio (ref 0.0–4.4)
Cholesterol, Total: 224 mg/dL — ABNORMAL HIGH (ref 100–199)
HDL: 91 mg/dL (ref >39)
LDL Chol Calc (NIH): 109 mg/dL — ABNORMAL HIGH (ref 0–99)
Triglycerides: 144 mg/dL (ref 0–149)
VLDL Cholesterol Cal: 24 mg/dL (ref 5–40)

## 2019-08-02 LAB — ANEMIA PANEL
Ferritin: 107 ng/mL (ref 15–150)
Folate, Hemolysate: 356 ng/mL
Folate, RBC: 832 ng/mL (ref >498)
Hematocrit: 42.8 % (ref 34.0–46.6)
Iron Saturation: 30 % (ref 15–55)
Iron: 100 ug/dL (ref 27–139)
Retic Ct Pct: 1.7 % (ref 0.6–2.6)
Total Iron Binding Capacity: 337 ug/dL (ref 250–450)
UIBC: 237 ug/dL (ref 118–369)
Vitamin B-12: 590 pg/mL (ref 232–1245)

## 2019-08-02 LAB — HEMOGLOBIN A1C
Est. average glucose Bld gHb Est-mCnc: 131 mg/dL
Hgb A1c MFr Bld: 6.2 % — ABNORMAL HIGH (ref 4.8–5.6)

## 2019-08-02 LAB — T4, FREE: Free T4: 0.81 ng/dL — ABNORMAL LOW (ref 0.82–1.77)

## 2019-08-02 LAB — INSULIN, RANDOM: INSULIN: 16.8 u[IU]/mL (ref 2.6–24.9)

## 2019-08-02 LAB — T3: T3, Total: 129 ng/dL (ref 71–180)

## 2019-08-07 ENCOUNTER — Encounter (INDEPENDENT_AMBULATORY_CARE_PROVIDER_SITE_OTHER): Payer: Self-pay | Admitting: Family Medicine

## 2019-08-07 NOTE — Telephone Encounter (Signed)
Please review

## 2019-08-10 ENCOUNTER — Encounter (INDEPENDENT_AMBULATORY_CARE_PROVIDER_SITE_OTHER): Payer: Self-pay

## 2019-08-15 ENCOUNTER — Ambulatory Visit (INDEPENDENT_AMBULATORY_CARE_PROVIDER_SITE_OTHER): Payer: 59 | Admitting: Family Medicine

## 2019-08-15 ENCOUNTER — Other Ambulatory Visit: Payer: Self-pay

## 2019-08-15 ENCOUNTER — Encounter (INDEPENDENT_AMBULATORY_CARE_PROVIDER_SITE_OTHER): Payer: Self-pay | Admitting: Family Medicine

## 2019-08-15 VITALS — BP 136/82 | HR 70 | Temp 98.1°F | Ht 60.0 in | Wt 224.0 lb

## 2019-08-15 DIAGNOSIS — M329 Systemic lupus erythematosus, unspecified: Secondary | ICD-10-CM | POA: Diagnosis not present

## 2019-08-15 DIAGNOSIS — IMO0002 Reserved for concepts with insufficient information to code with codable children: Secondary | ICD-10-CM

## 2019-08-15 DIAGNOSIS — R7303 Prediabetes: Secondary | ICD-10-CM | POA: Diagnosis not present

## 2019-08-15 DIAGNOSIS — Z9189 Other specified personal risk factors, not elsewhere classified: Secondary | ICD-10-CM | POA: Diagnosis not present

## 2019-08-15 DIAGNOSIS — E66813 Obesity, class 3: Secondary | ICD-10-CM

## 2019-08-15 DIAGNOSIS — Z6841 Body Mass Index (BMI) 40.0 and over, adult: Secondary | ICD-10-CM

## 2019-08-16 MED ORDER — SAXENDA 18 MG/3ML ~~LOC~~ SOPN: 3.0000 mg | PEN_INJECTOR | Freq: Every day | SUBCUTANEOUS | 0 refills | Status: DC

## 2019-08-16 NOTE — Progress Notes (Addendum)
Chief Complaint:   OBESITY [name] is here to discuss her progress with her obesity treatment plan along with follow-up of her obesity related diagnoses. [name] is on the Category 2 Plan and states she is following her eating plan approximately 75% of the time. [name] states she is swimming and biking for 60 minutes 2-3 times per week.  Today's visit was #: 2 Starting weight: 227 lbs Starting date: 08/01/2019 Today's weight: 224 lbs Today's date: 08/15/2019 Total lbs lost to date: 3 lbs Total lbs lost since last in-office visit: 3 lbs  Interim History: [name] is currently having a lupus flare.  Subjective:   1. Prediabetes [name] has a diagnosis of prediabetes based on her elevated HgA1c and was informed this puts her at greater risk of developing diabetes. She continues to work on diet and exercise to decrease her risk of diabetes. She denies nausea or hypoglycemia.  Lab Results  Component Value Date   HGBA1C 6.2 (H) 08/01/2019   Lab Results  Component Value Date   INSULIN 16.8 08/01/2019   2. Lupus (HCC) With flare.  She may need to temporarily increase her prednisone.    3. At risk for constipation [name] is at increased risk for constipation due to inadequate water intake, changes in diet, and/or use of medications such as GLP1 agonists. [name] denies hard, infrequent stools currently.   Assessment/Plan:   1. Prediabetes [name] will continue to work on weight loss, exercise, and decreasing simple carbohydrates to help decrease the risk of diabetes.   2. Lupus (HCC) Reviewed prednisone side effects.  3. At risk for constipation [name] was given approximately 15 minutes of counseling today regarding prevention of constipation. She was encouraged to increase water and fiber intake.   4. Class 3 severe obesity with serious comorbidity and body mass index (BMI) of 40.0 to 44.9 in adult, unspecified obesity type (HCC)  Orders - Liraglutide -Weight Management (SAXENDA) 18 MG/3ML SOPN;  Inject 0.5 mLs (3 mg total) into the skin daily.  Dispense: 5 pen; Refill: 0  [name] is currently in the action stage of change. As such, her goal is to continue with weight loss efforts. She has agreed to the Category 2 Plan.   Exercise goals: As is.  Behavioral modification strategies: decreasing simple carbohydrates.  [name] has agreed to follow-up with our clinic in 2-3 weeks. She was informed of the importance of frequent follow-up visits to maximize her success with intensive lifestyle modifications for her multiple health conditions.   Objective:   Blood pressure 136/82, pulse 70, temperature 98.1 F (36.7 C), temperature source Oral, height 5' (1.524 m), weight 224 lb (101.6 kg), SpO2 95 %. Body mass index is 43.75 kg/m.  General: Cooperative, alert, well developed, in no acute distress. HEENT: Conjunctivae and lids unremarkable. Cardiovascular: Regular rhythm.  Lungs: Normal work of breathing. Neurologic: No focal deficits.   Lab Results  Component Value Date   CREATININE 0.73 08/01/2019   BUN 14 08/01/2019   NA 143 08/01/2019   K 4.0 08/01/2019   CL 103 08/01/2019   CO2 26 08/01/2019   Lab Results  Component Value Date   ALT 32 08/01/2019   AST 34 08/01/2019   ALKPHOS 98 08/01/2019   BILITOT 0.3 08/01/2019   Lab Results  Component Value Date   HGBA1C 6.2 (H) 08/01/2019   Lab Results  Component Value Date   INSULIN 16.8 08/01/2019   Lab Results  Component Value Date   TSH 1.950 08/01/2019  Lab Results  Component Value Date   CHOL 224 (H) 08/01/2019   HDL 91 08/01/2019   LDLCALC 109 (H) 08/01/2019   TRIG 144 08/01/2019   CHOLHDL 2.5 08/01/2019   Lab Results  Component Value Date   WBC 6.9 08/01/2019   HGB 14.1 08/01/2019   HCT 42.8 08/01/2019   MCV 92 08/01/2019   PLT 233 08/01/2019   Lab Results  Component Value Date   IRON 100 08/01/2019   TIBC 337 08/01/2019   FERRITIN 107 08/01/2019   Attestation Statements:   Reviewed by clinician  on day of visit: allergies, medications, problem list, medical history, surgical history, family history, social history, and previous encounter notes.  I, Insurance claims handler, CMA, am acting as transcriptionist for Helane Rima, DO  I have reviewed the above documentation for accuracy and completeness, and I agree with the above. Helane Rima, DO

## 2019-08-17 ENCOUNTER — Encounter (INDEPENDENT_AMBULATORY_CARE_PROVIDER_SITE_OTHER): Payer: Self-pay | Admitting: Family Medicine

## 2019-08-20 ENCOUNTER — Encounter (INDEPENDENT_AMBULATORY_CARE_PROVIDER_SITE_OTHER): Payer: Self-pay | Admitting: Family Medicine

## 2019-08-29 MED ORDER — WEGOVY 0.25 MG/0.5ML ~~LOC~~ SOAJ: 0.2500 mg | SUBCUTANEOUS | 0 refills | Status: DC

## 2019-08-29 NOTE — Telephone Encounter (Signed)
[place] 0.25

## 2019-08-29 NOTE — Telephone Encounter (Signed)
Please see

## 2019-09-06 ENCOUNTER — Ambulatory Visit (INDEPENDENT_AMBULATORY_CARE_PROVIDER_SITE_OTHER): Payer: 59 | Admitting: Family Medicine

## 2019-09-19 ENCOUNTER — Encounter (INDEPENDENT_AMBULATORY_CARE_PROVIDER_SITE_OTHER): Payer: Self-pay | Admitting: Family Medicine

## 2019-09-19 ENCOUNTER — Ambulatory Visit (INDEPENDENT_AMBULATORY_CARE_PROVIDER_SITE_OTHER): Payer: 59 | Admitting: Family Medicine

## 2019-09-19 ENCOUNTER — Other Ambulatory Visit: Payer: Self-pay

## 2019-09-19 VITALS — BP 132/84 | HR 78 | Temp 98.2°F | Ht 60.0 in | Wt 225.0 lb

## 2019-09-19 DIAGNOSIS — R7303 Prediabetes: Secondary | ICD-10-CM | POA: Diagnosis not present

## 2019-09-19 DIAGNOSIS — F3289 Other specified depressive episodes: Secondary | ICD-10-CM

## 2019-09-19 DIAGNOSIS — Z9189 Other specified personal risk factors, not elsewhere classified: Secondary | ICD-10-CM

## 2019-09-19 DIAGNOSIS — E8881 Metabolic syndrome: Secondary | ICD-10-CM

## 2019-09-19 DIAGNOSIS — E65 Localized adiposity: Secondary | ICD-10-CM | POA: Diagnosis not present

## 2019-09-19 DIAGNOSIS — Z6841 Body Mass Index (BMI) 40.0 and over, adult: Secondary | ICD-10-CM
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
## 2019-09-20 NOTE — Progress Notes (Signed)
Chief Complaint:   OBESITY [name] is here to discuss her progress with her obesity treatment plan along with follow-up of her obesity related diagnoses. [name] is on the Category 2 Plan and states she is following her eating plan approximately 30% of the time. Linnie states she is doing PT, cardio, and strength training for 60-120 minutes 2-7 times per week.  Today's visit was #: 3 Starting weight: 227 lbs Starting date: 08/01/2019 Today's weight: 225 lbs Today's date: 09/19/2019 Total lbs lost to date: 2 lbs Total lbs lost since last in-office visit: 0  Interim History: [name] says she has been off plan somewhat over the past few weeks.  She has had increased stress and increased hunger.  She reports that the obesity medications are not covered by her insurance.   Subjective:   1. Prediabetes  Lab Results  Component Value Date   HGBA1C 6.2 (H) 08/01/2019   Lab Results  Component Value Date   INSULIN 16.8 08/01/2019   2. Metabolic syndrome Risk factors (3 or more constitute Metabolic Syndrome): waist > 35" for female blood pressure > 130/85 impaired fasting blood sugar.  3. Visceral obesity Christan's visceral fat rating per bioimpedence is 18.  4. Other depression, with emotional eating [name] is struggling with emotional eating and using food for comfort to the extent that it is negatively impacting her health. She has been working on behavior modification techniques to help reduce her emotional eating and has been minimally successful. She shows no sign of suicidal or homicidal ideations.  5. At risk for heart disease [name] is at a higher than average risk for cardiovascular disease due to obesity.   Assessment/Plan:   1. Prediabetes Not at goal.Goal is HgbA1c < 5.7 and insulin level closer to 5. Tanyla will continue to work on weight loss, exercise, and decreasing simple carbohydrates to help decrease the risk of diabetes.   2. Metabolic syndrome Not at goal. Counseling:  Intensive lifestyle modifications are the first line treatment for this issue. We discussed several lifestyle modifications today and she will continue to work on diet, exercise and weight loss efforts. We will continue to monitor.   Orders - Semaglutide,0.25 or 0.5MG /DOS, (OZEMPIC, 0.25 OR 0.5 MG/DOSE,) 2 MG/1.5ML SOPN; Inject 0.375 mLs (0.5 mg total) into the skin once a week.  Dispense: 1.5 mL; Refill: 0 - Insulin Pen Needle 32G X 4 MM MISC; 1 each by Does not apply route once a week.  Dispense: 50 each; Refill: 0  3. Visceral obesity Visceral adipose tissue is a hormonally active component of total body fat. This body composition phenotype is associated with medical disorders such as metabolic syndrome, cardiovascular disease and several malignancies including prostate, breast, and colorectal cancers.   4. Other depression, with emotional eating Patient was referred to Dr. Dewaine Conger, our Bariatric Psychologist, for evaluation due to her elevated PHQ-9 score and significant struggles with emotional eating.  5. At risk for heart disease [name] was given approximately 15 minutes of coronary artery disease prevention counseling today. She is 59 y.o. female and has risk factors for heart disease including obesity and metabolic syndrome. We discussed intensive lifestyle modifications today with an emphasis on specific weight loss instructions and strategies.   Repetitive spaced learning was employed today to elicit superior memory formation and behavioral change.  6. Class 3 severe obesity with serious comorbidity and body mass index (BMI) of 40.0 to 44.9 in adult, unspecified obesity type [facility]) [name] is currently in the action stage  of change. As such, her goal is to continue with weight loss efforts. She has agreed to the Category 2 Plan.   Exercise goals: For substantial health benefits, adults should do at least 150 minutes (2 hours and 30 minutes) a week of moderate-intensity, or 75 minutes (1 hour  and 15 minutes) a week of vigorous-intensity aerobic physical activity, or an equivalent combination of moderate- and vigorous-intensity aerobic activity. Aerobic activity should be performed in episodes of at least 10 minutes, and preferably, it should be spread throughout the week.  Behavioral modification strategies: increasing lean protein intake, increasing vegetables, increasing water intake and increasing high fiber foods.  [name] has agreed to follow-up with our clinic in 2-3 weeks. She was informed of the importance of frequent follow-up visits to maximize her success with intensive lifestyle modifications for her multiple health conditions.   Objective:   Blood pressure 132/84, pulse 78, temperature 98.2 F (36.8 C), temperature source Oral, height 5' (1.524 m), weight 225 lb (102.1 kg), SpO2 94 %. Body mass index is 43.94 kg/m.  General: Cooperative, alert, well developed, in no acute distress. HEENT: Conjunctivae and lids unremarkable. Cardiovascular: Regular rhythm.  Lungs: Normal work of breathing. Neurologic: No focal deficits.   Lab Results  Component Value Date   CREATININE 0.73 08/01/2019   BUN 14 08/01/2019   NA 143 08/01/2019   K 4.0 08/01/2019   CL 103 08/01/2019   CO2 26 08/01/2019   Lab Results  Component Value Date   ALT 32 08/01/2019   AST 34 08/01/2019   ALKPHOS 98 08/01/2019   BILITOT 0.3 08/01/2019   Lab Results  Component Value Date   HGBA1C 6.2 (H) 08/01/2019   Lab Results  Component Value Date   INSULIN 16.8 08/01/2019   Lab Results  Component Value Date   TSH 1.950 08/01/2019   Lab Results  Component Value Date   CHOL 224 (H) 08/01/2019   HDL 91 08/01/2019   LDLCALC 109 (H) 08/01/2019   TRIG 144 08/01/2019   CHOLHDL 2.5 08/01/2019   Lab Results  Component Value Date   WBC 6.9 08/01/2019   HGB 14.1 08/01/2019   HCT 42.8 08/01/2019   MCV 92 08/01/2019   PLT 233 08/01/2019   Lab Results  Component Value Date   IRON 100  08/01/2019   TIBC 337 08/01/2019   FERRITIN 107 08/01/2019   Attestation Statements:   Reviewed by clinician on day of visit: allergies, medications, problem list, medical history, surgical history, family history, social history, and previous encounter notes.  I, Insurance claims handler, CMA, am acting as transcriptionist for Helane Rima, DO  I have reviewed the above documentation for accuracy and completeness, and I agree with the above. Helane Rima, DO

## 2019-09-21 MED ORDER — INSULIN PEN NEEDLE 32G X 4 MM MISC: 1.0000 | 0 refills | Status: DC

## 2019-09-21 MED ORDER — OZEMPIC (0.25 OR 0.5 MG/DOSE) 2 MG/1.5ML ~~LOC~~ SOPN: 0.5000 mg | PEN_INJECTOR | SUBCUTANEOUS | 0 refills | Status: DC

## 2019-09-26 ENCOUNTER — Encounter (INDEPENDENT_AMBULATORY_CARE_PROVIDER_SITE_OTHER): Payer: Self-pay | Admitting: Family Medicine

## 2019-09-26 DIAGNOSIS — E8881 Metabolic syndrome: Secondary | ICD-10-CM

## 2019-09-26 MED ORDER — SAXENDA 18 MG/3ML ~~LOC~~ SOPN: 3.0000 mg | PEN_INJECTOR | Freq: Every day | SUBCUTANEOUS | 0 refills | Status: DC

## 2019-09-26 MED ORDER — INSULIN PEN NEEDLE 32G X 4 MM MISC: 1.0000 | 0 refills | Status: DC

## 2019-09-27 ENCOUNTER — Encounter (INDEPENDENT_AMBULATORY_CARE_PROVIDER_SITE_OTHER): Payer: Self-pay | Admitting: Family Medicine

## 2019-09-28 ENCOUNTER — Telehealth (INDEPENDENT_AMBULATORY_CARE_PROVIDER_SITE_OTHER): Payer: 59 | Admitting: Psychology

## 2019-09-28 DIAGNOSIS — F419 Anxiety disorder, unspecified: Secondary | ICD-10-CM | POA: Diagnosis not present

## 2019-09-28 DIAGNOSIS — F5089 Other specified eating disorder: Secondary | ICD-10-CM | POA: Diagnosis not present

## 2019-10-03 ENCOUNTER — Other Ambulatory Visit (INDEPENDENT_AMBULATORY_CARE_PROVIDER_SITE_OTHER): Payer: Self-pay | Admitting: Family Medicine

## 2019-10-03 DIAGNOSIS — E8881 Metabolic syndrome: Secondary | ICD-10-CM

## 2019-10-05 MED ORDER — SAXENDA 18 MG/3ML ~~LOC~~ SOPN: 3.0000 mg | PEN_INJECTOR | Freq: Every day | SUBCUTANEOUS | 0 refills | Status: DC

## 2019-10-05 NOTE — Progress Notes (Signed)
  Office: [phone]  /  Fax: [phone]    Date: October 19, 2019   Appointment Start Time: 4:36pm Duration: 22 minutes Provider: Lawerance Cruel, Psy.D. Type of Session: Individual Therapy  Location of Patient: Work Location of Provider: Provider's Home Type of Contact: Telepsychological Visit via MyChart Video Visit  Session Content: [name] is a 59 y.o. female presenting for a follow-up appointment to address the previously established treatment goal of increasing coping skills. Today's appointment was a telepsychological visit due to COVID-19. [name] provided verbal consent for today's telepsychological appointment and she is aware she is responsible for securing confidentiality on her end of the session. Prior to proceeding with today's appointment, [name]'s physical location at the time of this appointment was obtained as well a phone number she could be reached at in the event of technical difficulties. [name] and this provider participated in today's telepsychological service.  Of note, today's appointment was switched to a regular telephone call at 4:53pm with [name]'s verbal consent due to technical issues.    This provider conducted a brief check-in. [name] shared about recent events, noting she is focusing on self-care. She also shared about recent physical activity and following her structured meal plan. Positive reinforcement was provided. Emotional and physical hunger were reviewed. Psychoeducation regarding triggers for emotional eating was provided. [name] was provided a handout, and encouraged to utilize the handout between now and the next appointment to increase awareness of triggers and frequency. [facility] agreed. This provider also discussed behavioral strategies for specific triggers, such as placing the utensil down when conversing to avoid mindless eating. [name] provided verbal consent during today's appointment for this provider to send a handout about triggers via e-mail. [name] was  receptive to today's appointment as evidenced by openness to sharing, responsiveness to feedback, and willingness to explore triggers for emotional eating.  Mental Status Examination:  Appearance: well groomed and appropriate hygiene  Behavior: appropriate to circumstances Mood: euthymic Affect: mood congruent Speech: normal in rate, volume, and tone Eye Contact: appropriate Psychomotor Activity: appropriate Gait: unable to assess Thought Process: linear, logical, and goal directed  Thought Content/Perception: denies suicidal and homicidal ideation, plan, and intent and no hallucinations, delusions, bizarre thinking or behavior reported or observed Orientation: time, person, place, and purpose of appointment Memory/Concentration: memory, attention, language, and fund of knowledge intact  Insight/Judgment: good  Interventions:  Conducted a brief chart review Provided empathic reflections and validation Reviewed content from the previous session Employed supportive psychotherapy interventions to facilitate reduced distress and to improve coping skills with identified stressors Psychoeducation provided regarding triggers for emotional eating  Provided positive reinforcement   DSM-5 Diagnosis(es): 311 (F32.8) Other Specified Depressive Disorder, Emotional Eating Behaviors and 300.00 (F41.9) Unspecified Anxiety Disorder  Treatment Goal & Progress: During the initial appointment with this provider, the following treatment goal was established: increase coping skills. [name] has demonstrated progress in her goal as evidenced by increased awareness of hunger patterns.   Plan: The next appointment will be scheduled in approximately two weeks, which will be via MyChart Video Visit. The next session will focus on working towards the established treatment goal.

## 2019-10-05 NOTE — Addendum Note (Signed)
Addended by: Scarlett Presto on: 10/05/2019 10:49 AM   Modules accepted: Orders

## 2019-10-17 ENCOUNTER — Ambulatory Visit (INDEPENDENT_AMBULATORY_CARE_PROVIDER_SITE_OTHER): Payer: 59 | Admitting: Family Medicine

## 2019-10-17 ENCOUNTER — Other Ambulatory Visit: Payer: Self-pay

## 2019-10-17 ENCOUNTER — Encounter (INDEPENDENT_AMBULATORY_CARE_PROVIDER_SITE_OTHER): Payer: Self-pay | Admitting: Family Medicine

## 2019-10-17 VITALS — BP 131/83 | HR 69 | Temp 98.1°F | Ht 60.0 in | Wt 225.0 lb

## 2019-10-17 DIAGNOSIS — Z9189 Other specified personal risk factors, not elsewhere classified: Secondary | ICD-10-CM

## 2019-10-17 DIAGNOSIS — Z6841 Body Mass Index (BMI) 40.0 and over, adult: Secondary | ICD-10-CM | POA: Diagnosis not present

## 2019-10-17 DIAGNOSIS — R7303 Prediabetes: Secondary | ICD-10-CM

## 2019-10-17 MED ORDER — METFORMIN HCL 500 MG PO TABS: 500.0000 mg | ORAL_TABLET | Freq: Every day | ORAL | 0 refills | Status: DC

## 2019-10-17 NOTE — Progress Notes (Signed)
Chief Complaint:   OBESITY Kaliope is here to discuss her progress with her obesity treatment plan along with follow-up of her obesity related diagnoses. Genever is on the Category 2 Plan and states she is following her eating plan approximately 70% of the time. Shanya states she is swimming/strength training/doing cardio/doing PT for 60 minutes 7 times per week.  Today's visit was #: 4 Starting weight: 227 lbs Starting date: 08/01/2019 Today's weight: 225 lbs Today's date: 10/17/2019 Total lbs lost to date: 2 lbs Total lbs lost since last in-office visit: 0  Interim History: Weight is same, but Teana feels better. NSV: Tori says she had to buy new undergarments.  Challenges: Endorses polyphagia.  Assessment/Plan:   1. Prediabetes Not optimized. Goal is HgbA1c < 5.7 and insulin level closer to 5. Will start metformin, as per below.  Lab Results  Component Value Date   HGBA1C 6.2 (H) 08/01/2019   Lab Results  Component Value Date   INSULIN 16.8 08/01/2019   -Start metFORMIN (GLUCOPHAGE) 500 MG tablet; Take 1 tablet (500 mg total) by mouth daily with breakfast.  Dispense: 30 tablet; Refill: 0  2. At risk for diarrhea Brayleigh is at risk for diarrhea due to taking metformin. She was advised to start the medication at a low dose, with the plan to advance slowly. Avoid meals high in sugar or fat as they can exacerbate diarrhea.   3. Class 3 severe obesity with serious comorbidity and body mass index (BMI) of 40.0 to 44.9 in adult, unspecified obesity type [facility]) Albirtha is currently in the action stage of change. As such, her goal is to continue with weight loss efforts. She has agreed to the Category 2 Plan.   Exercise goals: As is.  Behavioral modification strategies: increasing lean protein intake and increasing water intake.  Auriel has agreed to follow-up with our clinic in 4 weeks. She was informed of the importance of frequent follow-up visits to maximize her success with intensive  lifestyle modifications for her multiple health conditions.   Objective:   Blood pressure 131/83, pulse 69, temperature 98.1 F (36.7 C), temperature source Oral, height 5' (1.524 m), weight 225 lb (102.1 kg), SpO2 92 %. Body mass index is 43.94 kg/m.  General: Cooperative, alert, well developed, in no acute distress. HEENT: Conjunctivae and lids unremarkable. Cardiovascular: Regular rhythm.  Lungs: Normal work of breathing. Neurologic: No focal deficits.   Lab Results  Component Value Date   CREATININE 0.73 08/01/2019   BUN 14 08/01/2019   NA 143 08/01/2019   K 4.0 08/01/2019   CL 103 08/01/2019   CO2 26 08/01/2019   Lab Results  Component Value Date   ALT 32 08/01/2019   AST 34 08/01/2019   ALKPHOS 98 08/01/2019   BILITOT 0.3 08/01/2019   Lab Results  Component Value Date   HGBA1C 6.2 (H) 08/01/2019   Lab Results  Component Value Date   INSULIN 16.8 08/01/2019   Lab Results  Component Value Date   TSH 1.950 08/01/2019   Lab Results  Component Value Date   CHOL 224 (H) 08/01/2019   HDL 91 08/01/2019   LDLCALC 109 (H) 08/01/2019   TRIG 144 08/01/2019   CHOLHDL 2.5 08/01/2019   Lab Results  Component Value Date   WBC 6.9 08/01/2019   HGB 14.1 08/01/2019   HCT 42.8 08/01/2019   MCV 92 08/01/2019   PLT 233 08/01/2019   Lab Results  Component Value Date   IRON 100 08/01/2019  TIBC 337 08/01/2019   FERRITIN 107 08/01/2019   Attestation Statements:   Reviewed by clinician on day of visit: allergies, medications, problem list, medical history, surgical history, family history, social history, and previous encounter notes.  I, Insurance claims handler, CMA, am acting as transcriptionist for Helane Rima, DO  I have reviewed the above documentation for accuracy and completeness, and I agree with the above. Helane Rima, DO

## 2019-10-19 ENCOUNTER — Telehealth (INDEPENDENT_AMBULATORY_CARE_PROVIDER_SITE_OTHER): Payer: 59 | Admitting: Psychology

## 2019-10-19 DIAGNOSIS — F5089 Other specified eating disorder: Secondary | ICD-10-CM | POA: Diagnosis not present

## 2019-10-19 DIAGNOSIS — F419 Anxiety disorder, unspecified: Secondary | ICD-10-CM

## 2019-10-19 NOTE — Progress Notes (Signed)
  Office: [phone]  /  Fax: [phone]    Date: November 01, 2019   Appointment Start Time: 10:31am Duration: 24 minutes Provider: Lawerance Cruel, Psy.D. Type of Session: Individual Therapy  Location of Patient: Work Location of Provider: Provider's Home Type of Contact: Telepsychological Visit via MyChart Video Visit  Session Content: [name] is a 59 y.o. female presenting for a follow-up appointment to address the previously established treatment goal of increasing coping skills. Today's appointment was a telepsychological visit due to COVID-19. [name] provided verbal consent for today's telepsychological appointment and she is aware she is responsible for securing confidentiality on her end of the session. Prior to proceeding with today's appointment, [name]'s physical location at the time of this appointment was obtained as well a phone number she could be reached at in the event of technical difficulties. [name] and this provider participated in today's telepsychological service.   This provider conducted a brief check-in. [name] reported, "I'm doing very well." She discussed having a routine for physical activity, noting it has helped with stress and emotional eating. She acknowledged challenges with eating on plan. This was explored further. It was reflected she is likely not consuming enough protein. Barriers/obstacles to eating on plan were explored. Additionally, [name] was engaged in problem solving to help her eat congruent to her meal plan (e.g., having a working plan for meals; doubling recipes; focusing on protein intake; engaging in some form of meal prep on the weekends). [name] was receptive to today's appointment as evidenced by openness to sharing, responsiveness to feedback, and willingness to implement discussed strategies.  Mental Status Examination:  Appearance: well groomed and appropriate hygiene  Behavior: appropriate to circumstances Mood: euthymic Affect: mood  congruent Speech: normal in rate, volume, and tone Eye Contact: appropriate Psychomotor Activity: appropriate Gait: unable to assess Thought Process: linear, logical, and goal directed  Thought Content/Perception: no hallucinations, delusions, bizarre thinking or behavior reported or observed and no evidence of suicidal and homicidal ideation, plan, and intent Orientation: time, person, place, and purpose of appointment Memory/Concentration: memory, attention, language, and fund of knowledge intact  Insight/Judgment: fair  Interventions:  Conducted a brief chart review Provided empathic reflections and validation Employed supportive psychotherapy interventions to facilitate reduced distress and to improve coping skills with identified stressors Employed motivational interviewing skills to assess patient's willingness/desire to adhere to recommended medical treatments and assignments Engaged patient in problem solving  DSM-5 Diagnosis(es): 307.59 (F50.8) Other Specified Feeding or Eating Disorder, Emotional Eating Behaviors and 300.00 (F41.9) Unspecified Anxiety Disorder  Treatment Goal & Progress: During the initial appointment with this provider, the following treatment goal was established: increase coping skills. [name] has demonstrated progress in her goal as evidenced by increased awareness of hunger patterns, increased awareness of triggers for emotional eating and reduction in emotional eating.   Plan: The next appointment will be scheduled in two weeks, which will be via MyChart Video Visit. The next session will focus on working towards the established treatment goal.

## 2019-11-01 ENCOUNTER — Other Ambulatory Visit: Payer: Self-pay

## 2019-11-01 ENCOUNTER — Telehealth (INDEPENDENT_AMBULATORY_CARE_PROVIDER_SITE_OTHER): Payer: 59 | Admitting: Psychology

## 2019-11-01 DIAGNOSIS — F419 Anxiety disorder, unspecified: Secondary | ICD-10-CM | POA: Diagnosis not present

## 2019-11-01 DIAGNOSIS — F5089 Other specified eating disorder: Secondary | ICD-10-CM | POA: Diagnosis not present

## 2019-11-01 NOTE — Progress Notes (Unsigned)
Office: [phone]  /  Fax: [phone]    Date: November 15, 2019   Appointment Start Time: *** Duration: *** minutes Provider: Lawerance Cruel, Psy.D. Type of Session: Individual Therapy  Location of Patient: {gbptloc:23249} Location of Provider: Provider's Home Type of Contact: Telepsychological Visit via MyChart Video Visit  Session Content: [name] is a 59 y.o. female presenting for a follow-up appointment to address the previously established treatment goal of increasing coping skills. Today's appointment was a telepsychological visit due to COVID-19. [name] provided verbal consent for today's telepsychological appointment and she is aware she is responsible for securing confidentiality on her end of the session. Prior to proceeding with today's appointment, [name]'s physical location at the time of this appointment was obtained as well a phone number she could be reached at in the event of technical difficulties. [name] and this provider participated in today's telepsychological service.   This provider conducted a brief check-in and verbally administered the PHQ-9 and GAD-7. *** [name] was receptive to today's appointment as evidenced by openness to sharing, responsiveness to feedback, and {gbreceptiveness:23401}.  Mental Status Examination:  Appearance: {Appearance:22431} Behavior: {Behavior:22445} Mood: {gbmood:21757} Affect: {Affect:22436} Speech: {Speech:22432} Eye Contact: {Eye Contact:22433} Psychomotor Activity: {Motor Activity:22434} Gait: {gbgait:23404} Thought Process: {thought process:22448}  Thought Content/Perception: {disturbances:22451} Orientation: {Orientation:22437} Memory/Concentration: {gbcognition:22449} Insight/Judgment: {Insight:22446}  Structured Assessments Results: The Patient Health Questionnaire-9 (PHQ-9) is a self-report measure that assesses symptoms and severity of depression over the course of the last two weeks. [name] obtained a score of *** suggesting  {GBPHQ9SEVERITY:21752}. [name] finds the endorsed symptoms to be {gbphq9difficulty:21754}. [0= Not at all; 1= Several days; 2= More than half the days; 3= Nearly every day] Little interest or pleasure in doing things ***  Feeling down, depressed, or hopeless ***  Trouble falling or staying asleep, or sleeping too much ***  Feeling tired or having little energy ***  Poor appetite or overeating ***  Feeling bad about yourself --- or that you are a failure or have let yourself or your family down ***  Trouble concentrating on things, such as reading the newspaper or watching television ***  Moving or speaking so slowly that other people could have noticed? Or the opposite --- being so fidgety or restless that you have been moving around a lot more than usual ***  Thoughts that you would be better off dead or hurting yourself in some way ***  PHQ-9 Score ***    The Generalized Anxiety Disorder-7 (GAD-7) is a brief self-report measure that assesses symptoms of anxiety over the course of the last two weeks. [name] obtained a score of *** suggesting {gbgad7severity:21753}. [name] finds the endorsed symptoms to be {gbphq9difficulty:21754}. [0= Not at all; 1= Several days; 2= Over half the days; 3= Nearly every day] Feeling nervous, anxious, on edge ***  Not being able to stop or control worrying ***  Worrying too much about different things ***  Trouble relaxing ***  Being so restless that it's hard to sit still ***  Becoming easily annoyed or irritable ***  Feeling afraid as if something awful might happen ***  GAD-7 Score ***   Interventions:  {Interventions for Progress Notes:23405}  DSM-5 Diagnosis(es): 311 (F32.8) Other Specified Depressive Disorder, Emotional Eating Behaviors and 300.00 (F41.9) Unspecified Anxiety Disorder  Treatment Goal & Progress: During the initial appointment with this provider, the following treatment goal was established: increase coping skills. [name] has demonstrated  progress in her goal as evidenced by {gbtxprogress:22839}. [name] also {gbtxprogress2:22951}.  Plan: The next appointment will be  scheduled in {gbweeks:21758}, which will be {gbtxmodality:23402}. The next session will focus on {Plan for Next Appointment:23400}.

## 2019-11-10 ENCOUNTER — Encounter (INDEPENDENT_AMBULATORY_CARE_PROVIDER_SITE_OTHER): Payer: Self-pay | Admitting: Family Medicine

## 2019-11-14 ENCOUNTER — Other Ambulatory Visit: Payer: Self-pay

## 2019-11-14 ENCOUNTER — Ambulatory Visit (INDEPENDENT_AMBULATORY_CARE_PROVIDER_SITE_OTHER): Payer: 59 | Admitting: Family Medicine

## 2019-11-14 ENCOUNTER — Encounter (INDEPENDENT_AMBULATORY_CARE_PROVIDER_SITE_OTHER): Payer: Self-pay | Admitting: Family Medicine

## 2019-11-14 VITALS — BP 123/83 | HR 70 | Temp 98.2°F | Ht 60.0 in | Wt 226.0 lb

## 2019-11-14 DIAGNOSIS — R632 Polyphagia: Secondary | ICD-10-CM

## 2019-11-14 DIAGNOSIS — F4310 Post-traumatic stress disorder, unspecified: Secondary | ICD-10-CM

## 2019-11-14 DIAGNOSIS — R7303 Prediabetes: Secondary | ICD-10-CM

## 2019-11-14 DIAGNOSIS — L4050 Arthropathic psoriasis, unspecified: Secondary | ICD-10-CM

## 2019-11-14 DIAGNOSIS — R5383 Other fatigue: Secondary | ICD-10-CM

## 2019-11-14 DIAGNOSIS — E8881 Metabolic syndrome: Secondary | ICD-10-CM | POA: Diagnosis not present

## 2019-11-14 DIAGNOSIS — E559 Vitamin D deficiency, unspecified: Secondary | ICD-10-CM

## 2019-11-14 DIAGNOSIS — F32A Depression, unspecified: Secondary | ICD-10-CM

## 2019-11-14 DIAGNOSIS — E782 Mixed hyperlipidemia: Secondary | ICD-10-CM

## 2019-11-14 DIAGNOSIS — F419 Anxiety disorder, unspecified: Secondary | ICD-10-CM

## 2019-11-14 DIAGNOSIS — Z6841 Body Mass Index (BMI) 40.0 and over, adult: Secondary | ICD-10-CM

## 2019-11-14 DIAGNOSIS — M797 Fibromyalgia: Secondary | ICD-10-CM

## 2019-11-14 NOTE — Progress Notes (Signed)
Chief Complaint:   OBESITY [name] is here to discuss her progress with her obesity treatment plan along with follow-up of her obesity related diagnoses. [name] is on the Category 2 Plan and states she is following her eating plan approximately 85% of the time. [name] states she is walking/doing cardio and strength training for 60 minutes 7 times per week.  Today's visit was #: 5 Starting weight: 227 lbs Starting date: 08/01/2019 Today's weight: 226 lbs Today's date: 11/14/2019 Total lbs lost to date: 1 lb  Interim History: [name] has added much more strength training and cardio workouts. She feels strong. She continues to eat according to meal plan goals.  Assessment/Plan:   1. Metabolic syndrome Goal: Lose 7-10% of starting weight. We discussed several lifestyle modifications today and she will continue to work on diet, exercise and weight loss efforts.   2. Prediabetes Not at goal. Goal is HgbA1c < 5.7 and insulin level closer to 5. She was unable to tolerate metformin.  Will start Ozempic 0.25 mg subcutaneously weekly. She will continue to focus on protein-rich, low simple carbohydrate foods. We reviewed the importance of hydration, regular exercise for stress reduction, and restorative sleep.   Lab Results  Component Value Date   HGBA1C 6.0 (H) 11/14/2019   Lab Results  Component Value Date   INSULIN 16.8 08/01/2019   3. Polyphagia Not controlled. After discussion, patient would like to start a GLP1RA. Expectations, risks, and potential side effects reviewed.   4. Psoriatic arthritis [name] is followed by Rheumatology and is treated with Plaquenil for RA. We will continue to monitor symptoms as they relate to her weight loss journey.  5. Mixed hyperlipidemia Cardiovascular risk and specific lipid/LDL goals reviewed.  We discussed several lifestyle modifications today and [name] will continue to work on diet, exercise and weight loss efforts.   Lipid-lowering medications: None  currently.  Lab Results  Component Value Date   ALT 27 11/14/2019   AST 28 11/14/2019   ALKPHOS 65 11/14/2019   BILITOT 0.3 11/14/2019   Lab Results  Component Value Date   CHOL 214 (H) 11/14/2019   HDL 92 11/14/2019   LDLCALC 107 (H) 11/14/2019   TRIG 86 11/14/2019   CHOLHDL 2.3 11/14/2019   The 10-year ASCVD risk score Denman George DC Jr., et al., 2013) is: 3.5%   Values used to calculate the score:     Age: 59 years     Sex: Female     Is Non-Hispanic African American: No     Diabetic: No     Tobacco smoker: No     Systolic Blood Pressure: 123 mmHg     Is BP treated: Yes     HDL Cholesterol: 92 mg/dL     Total Cholesterol: 214 mg/dL  6. Vitamin D deficiency Current vitamin D is 37.4, tested on 08/01/2019. Not at goal. Optimal goal > 50 ng/dL. Plan: Continue Vitamin D @50 ,000 IU every week with follow-up for routine testing of Vitamin D at least 2-3 times per year to avoid over-replacement.  7. Fibromyalgia Intensive lifestyle modifications are the first line treatment for this issue. We discussed several lifestyle modifications today and she will continue to work on diet, exercise and weight loss efforts. We will continue to monitor symptoms as they relate to her weight loss journey.  8. PTSD [name] sees Dr. Corrie Dandy for help with PTSD, anxiety, and depression.  She is taking Pristiq and Rexulti. We will continue to monitor symptoms as they relate to  her weight loss journey.  9. Anxiety and depression, with emotional eating [name] is working with Dr. Dewaine Conger.  Behavior modification techniques were discussed today to help [name] deal with her emotional/non-hunger eating behaviors.    10. Class 3 severe obesity with serious comorbidity and body mass index (BMI) of 40.0 to 44.9 in adult, unspecified obesity type [name])  [name] is currently in the action stage of change. As such, her goal is to continue with weight loss efforts. She has agreed to keeping a food journal and adhering to  recommended goals of 1200 calories and 95 grams of protein.   Exercise goals: For substantial health benefits, adults should do at least 150 minutes (2 hours and 30 minutes) a week of moderate-intensity, or 75 minutes (1 hour and 15 minutes) a week of vigorous-intensity aerobic physical activity, or an equivalent combination of moderate- and vigorous-intensity aerobic activity. Aerobic activity should be performed in episodes of at least 10 minutes, and preferably, it should be spread throughout the week.  Behavioral modification strategies: increasing lean protein intake, decreasing simple carbohydrates and increasing vegetables.  [name] has agreed to follow-up with our clinic in 3 weeks. She was informed of the importance of frequent follow-up visits to maximize her success with intensive lifestyle modifications for her multiple health conditions.   [name] was informed we would discuss her lab results at her next visit unless there is a critical issue that needs to be addressed sooner. [name] agreed to keep her next visit at the agreed upon time to discuss these results.  Objective:   There were no vitals taken for this visit. There is no height or weight on file to calculate BMI.  General: Cooperative, alert, well developed, in no acute distress. HEENT: Conjunctivae and lids unremarkable. Cardiovascular: Regular rhythm.  Lungs: Normal work of breathing. Neurologic: No focal deficits.   Lab Results  Component Value Date   CREATININE 0.73 08/01/2019   BUN 14 08/01/2019   NA 143 08/01/2019   K 4.0 08/01/2019   CL 103 08/01/2019   CO2 26 08/01/2019   Lab Results  Component Value Date   ALT 32 08/01/2019   AST 34 08/01/2019   ALKPHOS 98 08/01/2019   BILITOT 0.3 08/01/2019   Lab Results  Component Value Date   HGBA1C 6.2 (H) 08/01/2019   Lab Results  Component Value Date   INSULIN 16.8 08/01/2019   Lab Results  Component Value Date   TSH 1.950 08/01/2019   Lab Results    Component Value Date   CHOL 224 (H) 08/01/2019   HDL 91 08/01/2019   LDLCALC 109 (H) 08/01/2019   TRIG 144 08/01/2019   CHOLHDL 2.5 08/01/2019   Lab Results  Component Value Date   WBC 6.9 08/01/2019   HGB 14.1 08/01/2019   HCT 42.8 08/01/2019   MCV 92 08/01/2019   PLT 233 08/01/2019   Lab Results  Component Value Date   IRON 100 08/01/2019   TIBC 337 08/01/2019   FERRITIN 107 08/01/2019   Attestation Statements:   Reviewed by clinician on day of visit: allergies, medications, problem list, medical history, surgical history, family history, social history, and previous encounter notes.  I, Insurance claims handler, CMA, am acting as transcriptionist for Helane Rima, DO  I have reviewed the above documentation for accuracy and completeness, and I agree with the above. Helane Rima, DO

## 2019-11-15 ENCOUNTER — Encounter (INDEPENDENT_AMBULATORY_CARE_PROVIDER_SITE_OTHER): Payer: Self-pay | Admitting: Family Medicine

## 2019-11-15 ENCOUNTER — Telehealth (INDEPENDENT_AMBULATORY_CARE_PROVIDER_SITE_OTHER): Payer: Self-pay | Admitting: Psychology

## 2019-11-15 LAB — COMPREHENSIVE METABOLIC PANEL
ALT: 27 IU/L (ref 0–32)
AST: 28 IU/L (ref 0–40)
Albumin/Globulin Ratio: 1.8 (ref 1.2–2.2)
Albumin: 4.3 g/dL (ref 3.8–4.8)
Alkaline Phosphatase: 65 IU/L (ref 44–121)
BUN/Creatinine Ratio: 25 (ref 12–28)
BUN: 18 mg/dL (ref 8–27)
Bilirubin Total: 0.3 mg/dL (ref 0.0–1.2)
CO2: 24 mmol/L (ref 20–29)
Calcium: 9.4 mg/dL (ref 8.7–10.3)
Chloride: 105 mmol/L (ref 96–106)
Creatinine, Ser: 0.72 mg/dL (ref 0.57–1.00)
GFR calc Af Amer: 105 mL/min/{1.73_m2} (ref >59)
GFR calc non Af Amer: 91 mL/min/{1.73_m2} (ref >59)
Globulin, Total: 2.4 g/dL (ref 1.5–4.5)
Glucose: 89 mg/dL (ref 65–99)
Potassium: 4.1 mmol/L (ref 3.5–5.2)
Sodium: 143 mmol/L (ref 134–144)
Total Protein: 6.7 g/dL (ref 6.0–8.5)

## 2019-11-15 LAB — CBC WITH DIFFERENTIAL/PLATELET
Basophils Absolute: 0 10*3/uL (ref 0.0–0.2)
Basos: 1 %
EOS (ABSOLUTE): 0.1 10*3/uL (ref 0.0–0.4)
Eos: 2 %
Hematocrit: 42.1 % (ref 34.0–46.6)
Hemoglobin: 13.5 g/dL (ref 11.1–15.9)
Immature Grans (Abs): 0 10*3/uL (ref 0.0–0.1)
Immature Granulocytes: 0 %
Lymphocytes Absolute: 2 10*3/uL (ref 0.7–3.1)
Lymphs: 32 %
MCH: 30.1 pg (ref 26.6–33.0)
MCHC: 32.1 g/dL (ref 31.5–35.7)
MCV: 94 fL (ref 79–97)
Monocytes Absolute: 0.5 10*3/uL (ref 0.1–0.9)
Monocytes: 8 %
Neutrophils Absolute: 3.6 10*3/uL (ref 1.4–7.0)
Neutrophils: 57 %
Platelets: 234 10*3/uL (ref 150–450)
RBC: 4.48 x10E6/uL (ref 3.77–5.28)
RDW: 12.8 % (ref 11.7–15.4)
WBC: 6.3 10*3/uL (ref 3.4–10.8)

## 2019-11-15 LAB — LIPID PANEL
Chol/HDL Ratio: 2.3 ratio (ref 0.0–4.4)
Cholesterol, Total: 214 mg/dL — ABNORMAL HIGH (ref 100–199)
HDL: 92 mg/dL (ref >39)
LDL Chol Calc (NIH): 107 mg/dL — ABNORMAL HIGH (ref 0–99)
Triglycerides: 86 mg/dL (ref 0–149)
VLDL Cholesterol Cal: 15 mg/dL (ref 5–40)

## 2019-11-15 LAB — TSH: TSH: 3.69 u[IU]/mL (ref 0.450–4.500)

## 2019-11-15 LAB — HEMOGLOBIN A1C
Est. average glucose Bld gHb Est-mCnc: 126 mg/dL
Hgb A1c MFr Bld: 6 % — ABNORMAL HIGH (ref 4.8–5.6)

## 2019-11-15 LAB — IRON,TIBC AND FERRITIN PANEL
Ferritin: 137 ng/mL (ref 15–150)
Iron Saturation: 36 % (ref 15–55)
Iron: 113 ug/dL (ref 27–139)
Total Iron Binding Capacity: 315 ug/dL (ref 250–450)
UIBC: 202 ug/dL (ref 118–369)

## 2019-11-15 LAB — T4, FREE: Free T4: 0.91 ng/dL (ref 0.82–1.77)

## 2019-11-15 LAB — VITAMIN B12: Vitamin B-12: 495 pg/mL (ref 232–1245)

## 2019-11-15 LAB — VITAMIN D 25 HYDROXY (VIT D DEFICIENCY, FRACTURES): Vit D, 25-Hydroxy: 43.4 ng/mL (ref 30.0–100.0)

## 2019-11-15 LAB — MAGNESIUM: Magnesium: 2.1 mg/dL (ref 1.6–2.3)

## 2019-11-15 MED ORDER — OZEMPIC (0.25 OR 0.5 MG/DOSE) 2 MG/1.5ML ~~LOC~~ SOPN: 0.2500 mg | PEN_INJECTOR | SUBCUTANEOUS | 0 refills | Status: DC

## 2019-11-17 ENCOUNTER — Encounter (INDEPENDENT_AMBULATORY_CARE_PROVIDER_SITE_OTHER): Payer: Self-pay | Admitting: Family Medicine

## 2019-12-04 ENCOUNTER — Telehealth (INDEPENDENT_AMBULATORY_CARE_PROVIDER_SITE_OTHER): Payer: Self-pay | Admitting: Psychology

## 2019-12-04 NOTE — Telephone Encounter (Signed)
°  Office: [phone]  /  Fax: [phone]  Date of Call: December 04, 2019  Time of Call: 11:26am Duration of Call: ~2 minutes Provider: Lawerance Cruel, PsyD  CONTENT: This provider called [name] to check-in and schedule a follow-up appointment. She was receptive to scheduling a follow-up appointment. A brief risk assessment was completed. [name] denied experiencing suicidal and homicidal ideation, plan, or intent since the last appointment with this provider.   PLAN: [name] is scheduled for an appointment on December 05, 2019 at 3:30pm via MyChart Video Visit.

## 2019-12-04 NOTE — Progress Notes (Signed)
Office: [phone]  /  Fax: [phone]    Date: December 05, 2019   Appointment Start Time: 3:35pm Duration: 25 minutes Provider: Lawerance Cruel, Psy.D. Type of Session: Individual Therapy  Location of Patient: Work Location of Provider: Provider's Home Type of Contact: Telepsychological Visit via MyChart Video Visit/Telephone call   Session Content: This provider called [name] at 3:32pm as she did not present for the telepsychological appointment. She noted difficulty joining the MyChart Video Visit; assistance was provided. This provider called again at 3:35pm and [name] reported difficulty connecting via MyChart Video Visit; however, she provided verbal consent to proceed via a telephone call. As such, today's appointment was initiated 5 minutes late. [name] is a 59 y.o. female presenting for a follow-up appointment to address the previously established treatment goal of increasing coping skills. Today's appointment was a telepsychological visit due to COVID-19. [name] provided verbal consent for today's telepsychological appointment and she is aware she is responsible for securing confidentiality on her end of the session. Prior to proceeding with today's appointment, [name]'s physical location at the time of this appointment was obtained as well a phone number she could be reached at in the event of technical difficulties. [name] and this provider participated in today's telepsychological service.   This provider conducted a brief check-in. [name] noted being busy with work; however, she reported she is trying to eat congruent to her meal plan and engage in physical activity. She explained work related stress has resulted in "feeling tired and fatigued all the time," which has subsequently led to emotional eating. Thus, psychoeducation regarding the importance of self-care utilizing the oxygen mask metaphor was provided. Psychoeducation regarding pleasurable activities, including its impact on emotional  eating and overall well-being was also provided. [name] was provided with a handout with various options of pleasurable activities, and was encouraged to engage in one activity a day and additional activities as needed when triggered to emotionally eat. [name] agreed. [name] provided verbal consent during today's appointment for this provider to send a handout with pleasurable activities via e-mail.  Notably, this provider discussed her upcoming maternity leave toward the end of November. [name] acknowledged understanding given the uncertain nature of the circumstances, this provider may be out of the office sooner. This provider and [name] discussed referral options and verbal consent was provided for this provider to send referral options via e-mail. All questions/concerns were addressed. [name] denied any concerns. [name] was receptive to today's appointment as evidenced by openness to sharing, responsiveness to feedback, and willingness to engage in pleasurable activities to assist with coping.  Mental Status Examination:  Appearance: unable to assess  Behavior: unable to assess Mood: euthymic Affect: unable to fully assess Speech: normal in rate, volume, and tone Eye Contact: unable to assess Psychomotor Activity: unable to assess Gait: unable to assess Thought Process: linear, logical, and goal directed  Thought Content/Perception: no hallucinations, delusions, bizarre thinking or behavior reported or observed and no evidence of suicidal and homicidal ideation, plan, and intent Orientation: time, person, place, and purpose of appointment Memory/Concentration: memory, attention, language, and fund of knowledge intact  Insight/Judgment: good  Interventions:  Conducted a brief chart review Provided empathic reflections and validation Employed supportive psychotherapy interventions to facilitate reduced distress and to improve coping skills with identified stressors Psychoeducation provided regarding  pleasurable activities Psychoeducation provided regarding self-care  DSM-5 Diagnosis(es): 307.59 (F50.8) Other Specified Feeding or Eating Disorder, Emotional Eating Behaviors and 300.00 (F41.9) Unspecified Anxiety Disorder  Treatment Goal & Progress: During the initial  appointment with this provider, the following treatment goal was established: increase coping skills. [name] has demonstrated progress in her goal as evidenced by increased awareness of hunger patterns and increased awareness of triggers for emotional eating. [name] also continues to demonstrate willingness to engage in learned skill(s).  Plan: The next appointment will be scheduled in aapproximately two weeks, which will be via MyChart Video Visit. The next session will focus on working towards the established treatment goal.

## 2019-12-05 ENCOUNTER — Other Ambulatory Visit: Payer: Self-pay

## 2019-12-05 ENCOUNTER — Telehealth (INDEPENDENT_AMBULATORY_CARE_PROVIDER_SITE_OTHER): Payer: 59 | Admitting: Psychology

## 2019-12-05 DIAGNOSIS — F419 Anxiety disorder, unspecified: Secondary | ICD-10-CM

## 2019-12-05 DIAGNOSIS — F5089 Other specified eating disorder: Secondary | ICD-10-CM | POA: Diagnosis not present

## 2019-12-05 NOTE — Progress Notes (Unsigned)
Office: [phone]  /  Fax: [phone]    Date: December 18, 2019   Appointment Start Time: *** Duration: *** minutes Provider: Lawerance Cruel, Psy.D. Type of Session: Individual Therapy  Location of Patient: {gbptloc:23249} Location of Provider: Provider's Home Type of Contact: Telepsychological Visit via MyChart Video Visit  Session Content: [name] is a 59 y.o. female presenting for a follow-up appointment to address the previously established treatment goal of increasing coping skills. Today's appointment was a telepsychological visit due to COVID-19. [name] provided verbal consent for today's telepsychological appointment and she is aware she is responsible for securing confidentiality on her end of the session. Prior to proceeding with today's appointment, [name]'s physical location at the time of this appointment was obtained as well a phone number she could be reached at in the event of technical difficulties. [name] and this provider participated in today's telepsychological service.   This provider conducted a brief check-in and verbally administered the PHQ-9 and GAD-7. *** [name] was receptive to today's appointment as evidenced by openness to sharing, responsiveness to feedback, and {gbreceptiveness:23401}.  Mental Status Examination:  Appearance: {Appearance:22431} Behavior: {Behavior:22445} Mood: {gbmood:21757} Affect: {Affect:22436} Speech: {Speech:22432} Eye Contact: {Eye Contact:22433} Psychomotor Activity: {Motor Activity:22434} Gait: {gbgait:23404} Thought Process: {thought process:22448}  Thought Content/Perception: {disturbances:22451} Orientation: {Orientation:22437} Memory/Concentration: {gbcognition:22449} Insight/Judgment: {Insight:22446}  Structured Assessments Results: The Patient Health Questionnaire-9 (PHQ-9) is a self-report measure that assesses symptoms and severity of depression over the course of the last two weeks. [name] obtained a score of *** suggesting  {GBPHQ9SEVERITY:21752}. [name] finds the endorsed symptoms to be {gbphq9difficulty:21754}. [0= Not at all; 1= Several days; 2= More than half the days; 3= Nearly every day] Little interest or pleasure in doing things ***  Feeling down, depressed, or hopeless ***  Trouble falling or staying asleep, or sleeping too much ***  Feeling tired or having little energy ***  Poor appetite or overeating ***  Feeling bad about yourself --- or that you are a failure or have let yourself or your family down ***  Trouble concentrating on things, such as reading the newspaper or watching television ***  Moving or speaking so slowly that other people could have noticed? Or the opposite --- being so fidgety or restless that you have been moving around a lot more than usual ***  Thoughts that you would be better off dead or hurting yourself in some way ***  PHQ-9 Score ***    The Generalized Anxiety Disorder-7 (GAD-7) is a brief self-report measure that assesses symptoms of anxiety over the course of the last two weeks. [name] obtained a score of *** suggesting {gbgad7severity:21753}. [name] finds the endorsed symptoms to be {gbphq9difficulty:21754}. [0= Not at all; 1= Several days; 2= Over half the days; 3= Nearly every day] Feeling nervous, anxious, on edge ***  Not being able to stop or control worrying ***  Worrying too much about different things ***  Trouble relaxing ***  Being so restless that it's hard to sit still ***  Becoming easily annoyed or irritable ***  Feeling afraid as if something awful might happen ***  GAD-7 Score ***   Interventions:  {Interventions for Progress Notes:23405}  DSM-5 Diagnosis(es): 307.59 (F50.8) Other Specified Feeding or Eating Disorder, Emotional Eating Behaviors and 300.00 (F41.9) Unspecified Anxiety Disorder  Treatment Goal & Progress: During the initial appointment with this provider, the following treatment goal was established: increase coping skills. [name] has  demonstrated progress in her goal as evidenced by {gbtxprogress:22839}. [name] also {gbtxprogress2:22951}.  Plan: The next appointment  will be scheduled in {gbweeks:21758}, which will be {gbtxmodality:23402}. The next session will focus on {Plan for Next Appointment:23400}.

## 2019-12-11 ENCOUNTER — Ambulatory Visit (INDEPENDENT_AMBULATORY_CARE_PROVIDER_SITE_OTHER): Payer: 59 | Admitting: Family Medicine

## 2019-12-11 ENCOUNTER — Encounter (INDEPENDENT_AMBULATORY_CARE_PROVIDER_SITE_OTHER): Payer: Self-pay | Admitting: Family Medicine

## 2019-12-11 ENCOUNTER — Other Ambulatory Visit: Payer: Self-pay

## 2019-12-11 VITALS — BP 138/85 | HR 70 | Temp 98.1°F | Ht 60.0 in | Wt 228.0 lb

## 2019-12-11 DIAGNOSIS — R632 Polyphagia: Secondary | ICD-10-CM | POA: Diagnosis not present

## 2019-12-11 DIAGNOSIS — R0602 Shortness of breath: Secondary | ICD-10-CM

## 2019-12-11 DIAGNOSIS — E8881 Metabolic syndrome: Secondary | ICD-10-CM

## 2019-12-11 DIAGNOSIS — R7303 Prediabetes: Secondary | ICD-10-CM

## 2019-12-11 DIAGNOSIS — Z6841 Body Mass Index (BMI) 40.0 and over, adult: Secondary | ICD-10-CM

## 2019-12-11 MED ORDER — VICTOZA 18 MG/3ML ~~LOC~~ SOPN: 1.8000 mg | PEN_INJECTOR | Freq: Every day | SUBCUTANEOUS | 0 refills | Status: DC

## 2019-12-11 MED ORDER — INSULIN PEN NEEDLE 32G X 4 MM MISC: 0 refills | Status: DC

## 2019-12-11 NOTE — Progress Notes (Signed)
Chief Complaint:   OBESITY [name] is here to discuss her progress with her obesity treatment plan along with follow-up of her obesity related diagnoses.   Today's visit was #: 6 Starting weight: 226 lbs Starting date: 08/01/2019 Today's weight: 228 lbs Today's date: 12/11/2019 Total lbs lost to date: +2 lbs Body mass index is 44.53 kg/m.   Interim History: [name] saw Dr. Dewaine Conger on 12/05/2019.  She says that she, "loses control in the evening when she gets home from the gym".  [name] provided the following calorie counts today:  Breakfast (7:00 am):  400 calories. Snack (10:00):  200 calories Lunch (1:00):  600 calories Snack (4:00):  200 calories Dinner (7:00):  500 calories  Nutrition Plan: the Category 2 Plan for 75% of the time.  Hunger is moderately controlled controlled. Cravings are moderately controlled controlled.  Activity: Walking at 2 miles an hour for 2 miles with strength training for 60+ minutes 6 times per week.  She does not have any pain.  Assessment/Plan:   1. Prediabetes Goal is [name] < 5.7 and insulin level closer to 5. Unfortunately, [name] will not cover anti-obesity medications. We will see if alternatives are covered for metabolic syndrome.  - Start liraglutide ([name]) 18 MG/3ML SOPN; Inject 1.8 mg into the skin daily.  Dispense: 9 mL; Refill: 0 - Insulin Pen Needle 32G X 4 MM MISC; Use daily with [name]  Dispense: 100 each; Refill: 0  Lab Results  Component Value Date   [name] 6.0 (H) 11/14/2019   Lab Results  Component Value Date   INSULIN 16.8 08/01/2019   2. Polyphagia Not controlled adequately to support [name]. We will keep working on medication coverage.   Plan:  We reviewed the importance of hydration, regular exercise for stress reduction, and restorative sleep.  3. Metabolic syndrome Starting goal: Lose 7-10% of starting weight. She will continue to focus on protein-rich, low simple carbohydrate foods. We reviewed the  importance of hydration, regular exercise for stress reduction, and restorative sleep.    Plan:  We will continue to check lab work every 3 months, with 10% weight loss, or should any other concerns arise.  4. SOB (shortness of breath) on exertion [name] says she has been having shortness of breath on exertion.    Plan:  Recheck IC today. Updated calorie and protein goals.  5. Class 3 severe obesity with serious comorbidity and body mass index (BMI) of 40.0 to 44.9 in adult, unspecified obesity type [name])  [name] is currently in the action stage of change. As such, her goal is to continue with weight loss efforts.   Nutrition goals: She has agreed to keeping a food journal and adhering to recommended goals of 1900 calories and >100 grams of protein.   Exercise goals: For substantial health benefits, adults should do at least 150 minutes (2 hours and 30 minutes) a week of moderate-intensity, or 75 minutes (1 hour and 15 minutes) a week of vigorous-intensity aerobic physical activity, or an equivalent combination of moderate- and vigorous-intensity aerobic activity. Aerobic activity should be performed in episodes of at least 10 minutes, and preferably, it should be spread throughout the week.  Behavioral modification strategies: increasing lean protein intake, decreasing simple carbohydrates, increasing vegetables and increasing water intake.  [name] has agreed to follow-up with our clinic in 3 weeks, for IC. She was informed of the importance of frequent follow-up visits to maximize her success with intensive lifestyle modifications for her multiple health conditions.  Objective:   Blood pressure 138/85, pulse 70, temperature 98.1 F (36.7 C), temperature source Oral, height 5' (1.524 m), weight 228 lb (103.4 kg), SpO2 96 %. Body mass index is 44.53 kg/m.  General: Cooperative, alert, well developed, in no acute distress. HEENT: Conjunctivae and lids unremarkable. Cardiovascular: Regular  rhythm.  Lungs: Normal work of breathing. Neurologic: No focal deficits.   Lab Results  Component Value Date   CREATININE 0.72 11/14/2019   BUN 18 11/14/2019   NA 143 11/14/2019   K 4.1 11/14/2019   CL 105 11/14/2019   CO2 24 11/14/2019   Lab Results  Component Value Date   ALT 27 11/14/2019   AST 28 11/14/2019   ALKPHOS 65 11/14/2019   BILITOT 0.3 11/14/2019   Lab Results  Component Value Date   [name] 6.0 (H) 11/14/2019   [name] 6.2 (H) 08/01/2019   Lab Results  Component Value Date   INSULIN 16.8 08/01/2019   Lab Results  Component Value Date   TSH 3.690 11/14/2019   Lab Results  Component Value Date   CHOL 214 (H) 11/14/2019   HDL 92 11/14/2019   LDLCALC 107 (H) 11/14/2019   TRIG 86 11/14/2019   CHOLHDL 2.3 11/14/2019   Lab Results  Component Value Date   WBC 6.3 11/14/2019   HGB 13.5 11/14/2019   HCT 42.1 11/14/2019   MCV 94 11/14/2019   PLT 234 11/14/2019   Lab Results  Component Value Date   IRON 113 11/14/2019   TIBC 315 11/14/2019   FERRITIN 137 11/14/2019   Attestation Statements:   Reviewed by clinician on day of visit: allergies, medications, problem list, medical history, surgical history, family history, social history, and previous encounter notes.  I, Insurance claims handler, CMA, am acting as transcriptionist for Helane Rima, DO  I have reviewed the above documentation for accuracy and completeness, and I agree with the above. Helane Rima, DO

## 2019-12-12 ENCOUNTER — Encounter (INDEPENDENT_AMBULATORY_CARE_PROVIDER_SITE_OTHER): Payer: Self-pay | Admitting: Family Medicine

## 2019-12-12 DIAGNOSIS — R7303 Prediabetes: Secondary | ICD-10-CM

## 2019-12-13 NOTE — Telephone Encounter (Signed)
Please advise 

## 2019-12-18 ENCOUNTER — Telehealth (INDEPENDENT_AMBULATORY_CARE_PROVIDER_SITE_OTHER): Payer: 59 | Admitting: Psychology

## 2019-12-25 ENCOUNTER — Telehealth (INDEPENDENT_AMBULATORY_CARE_PROVIDER_SITE_OTHER): Payer: Self-pay | Admitting: Psychology

## 2019-12-25 NOTE — Telephone Encounter (Signed)
°  Office: [phone]  /  Fax: [phone]  Date of Call: December 25, 2019  Time of Call: 11:22am Duration of Call: ~ 3 minutes Provider: Lawerance Cruel, PsyD  CONTENT: This provider called [name] to check-in and schedule a follow-up appointment. [name] stated she is doing "very well," noting she would prefer to hold off on scheduling a follow-up appointment at this time. She acknowledged understanding that she may request a follow-up appointment with this provider in the future (following this provider's maternity leave as previously discussed) as long as she is still established with the clinic. Additionally, [name] expressed willingness to utilize shared referrals if needed. A brief risk assessment was completed. [name] denied experiencing suicidal and homicidal ideation, plan, or intent since the last appointment with this provider.   PLAN:  No further follow-up planned by this provider.

## 2020-01-01 MED ORDER — VICTOZA 18 MG/3ML ~~LOC~~ SOPN: 1.2000 mg | PEN_INJECTOR | Freq: Every day | SUBCUTANEOUS | 0 refills | Status: DC

## 2020-01-01 NOTE — Addendum Note (Signed)
Addended by: Scarlett Presto on: 01/01/2020 10:38 AM   Modules accepted: Orders

## 2020-01-08 ENCOUNTER — Encounter (INDEPENDENT_AMBULATORY_CARE_PROVIDER_SITE_OTHER): Payer: Self-pay | Admitting: Bariatrics

## 2020-01-08 ENCOUNTER — Other Ambulatory Visit: Payer: Self-pay

## 2020-01-08 ENCOUNTER — Ambulatory Visit (INDEPENDENT_AMBULATORY_CARE_PROVIDER_SITE_OTHER): Payer: 59 | Admitting: Bariatrics

## 2020-01-08 VITALS — BP 145/74 | HR 72 | Temp 98.0°F | Ht 60.0 in | Wt 226.0 lb

## 2020-01-08 DIAGNOSIS — I10 Essential (primary) hypertension: Secondary | ICD-10-CM

## 2020-01-08 DIAGNOSIS — Z9189 Other specified personal risk factors, not elsewhere classified: Secondary | ICD-10-CM

## 2020-01-08 DIAGNOSIS — E7849 Other hyperlipidemia: Secondary | ICD-10-CM | POA: Diagnosis not present

## 2020-01-08 DIAGNOSIS — Z6841 Body Mass Index (BMI) 40.0 and over, adult: Secondary | ICD-10-CM

## 2020-01-08 DIAGNOSIS — R7303 Prediabetes: Secondary | ICD-10-CM | POA: Diagnosis not present

## 2020-01-08 MED ORDER — METFORMIN HCL 500 MG PO TABS: 500.0000 mg | ORAL_TABLET | Freq: Two times a day (BID) | ORAL | 0 refills | Status: DC

## 2020-01-08 NOTE — Telephone Encounter (Signed)
PA submitted.

## 2020-01-08 NOTE — Progress Notes (Signed)
Chief Complaint:   OBESITY [name] is here to discuss her progress with her obesity treatment plan along with follow-up of her obesity related diagnoses. [name] is on the Category 2 Plan and states she is following her eating plan approximately 75% of the time. [name] states she is doing cardio/strengthening 1.5 to 2 hours 6 times per week.  Today's visit was #: 7 Starting weight: 227 lbs Starting date: 08/01/2019 Today's weight: 226 lbs Today's date: 01/08/2020 Total lbs lost to date: 1 Total lbs lost since last in-office visit: 12  Interim History: [name] is down 2 lbs since her last visit. She reports doing well with her water intake.  Subjective:   Essential hypertension. [name] is taking Zestoretic. Blood pressure is reasonably well controlled.  BP Readings from Last 3 Encounters:  01/08/20 (!) 145/74  12/11/19 138/85  11/14/19 123/83   Lab Results  Component Value Date   CREATININE 0.72 11/14/2019   CREATININE 0.73 08/01/2019   CREATININE 0.83 11/08/2017   Other hyperlipidemia. [name] is taking Lipitor.  Lab Results  Component Value Date   CHOL 214 (H) 11/14/2019   HDL 92 11/14/2019   LDLCALC 107 (H) 11/14/2019   TRIG 86 11/14/2019   CHOLHDL 2.3 11/14/2019   Lab Results  Component Value Date   ALT 27 11/14/2019   AST 28 11/14/2019   ALKPHOS 65 11/14/2019   BILITOT 0.3 11/14/2019   The 10-year ASCVD risk score Denman George DC Jr., et al., 2013) is: 4.8%   Values used to calculate the score:     Age: 59 years     Sex: Female     Is Non-Hispanic African American: No     Diabetic: No     Tobacco smoker: No     Systolic Blood Pressure: 145 mmHg     Is BP treated: Yes     HDL Cholesterol: 92 mg/dL     Total Cholesterol: 214 mg/dL  Prediabetes. [name] has a diagnosis of prediabetes based on her elevated HgA1c and was informed this puts her at greater risk of developing diabetes. She continues to work on diet and exercise to decrease her risk of diabetes. She denies  nausea or hypoglycemia. [name] endorses increased appetite.  Lab Results  Component Value Date   HGBA1C 6.0 (H) 11/14/2019   Lab Results  Component Value Date   INSULIN 16.8 08/01/2019   At risk of diabetes mellitus. [name] is at higher than average risk for developing diabetes due to prediabetes.   Assessment/Plan:   Essential hypertension. [name] is working on healthy weight loss and exercise to improve blood pressure control. We will watch for signs of hypotension as she continues her lifestyle modifications. She will continue her medication as directed.   Other hyperlipidemia. Cardiovascular risk and specific lipid/LDL goals reviewed.  We discussed several lifestyle modifications today and [name] will continue to work on diet, exercise and weight loss efforts. Orders and follow up as documented in patient record. She will continue Lipitor as directed.   Counseling Intensive lifestyle modifications are the first line treatment for this issue. . Dietary changes: Increase soluble fiber. Decrease simple carbohydrates. . Exercise changes: Moderate to vigorous-intensity aerobic activity 150 minutes per week if tolerated. . Lipid-lowering medications: see documented in medical record.  Prediabetes. [name] will continue to work on weight loss, exercise, and decreasing simple carbohydrates to help decrease the risk of diabetes. Prescription was given for metFORMIN (GLUCOPHAGE) 500 MG tablet 1 BID with meals #60 with 0  refills.  At risk of diabetes mellitus. [name] was given approximately 15 minutes of diabetes education and counseling today. We discussed intensive lifestyle modifications today with an emphasis on weight loss as well as increasing exercise and decreasing simple carbohydrates in her diet. We also reviewed medication options with an emphasis on risk versus benefit of those discussed.   Repetitive spaced learning was employed today to elicit superior memory formation and behavioral  change.  Class 3 severe obesity with serious comorbidity and body mass index (BMI) of 40.0 to 44.9 in adult, unspecified obesity type (HCC).  [name] is currently in the action stage of change. As such, her goal is to continue with weight loss efforts. She has agreed to the Category 2 Plan.    She will adhere closely to the plan and work on meal planning.  Exercise goals: [name] has increased her exercise and will continue.  Behavioral modification strategies: increasing lean protein intake, decreasing simple carbohydrates, increasing vegetables, increasing water intake, decreasing eating out, no skipping meals, meal planning and cooking strategies, keeping healthy foods in the home and planning for success.  [name] has agreed to follow-up with our clinic in 2-3 weeks. She was informed of the importance of frequent follow-up visits to maximize her success with intensive lifestyle modifications for her multiple health conditions.   Objective:   Blood pressure (!) 145/74, pulse 72, temperature 98 F (36.7 C), height 5' (1.524 m), weight 226 lb (102.5 kg), SpO2 94 %. Body mass index is 44.14 kg/m.  General: Cooperative, alert, well developed, in no acute distress. HEENT: Conjunctivae and lids unremarkable. Cardiovascular: Regular rhythm.  Lungs: Normal work of breathing. Neurologic: No focal deficits.   Lab Results  Component Value Date   CREATININE 0.72 11/14/2019   BUN 18 11/14/2019   NA 143 11/14/2019   K 4.1 11/14/2019   CL 105 11/14/2019   CO2 24 11/14/2019   Lab Results  Component Value Date   ALT 27 11/14/2019   AST 28 11/14/2019   ALKPHOS 65 11/14/2019   BILITOT 0.3 11/14/2019   Lab Results  Component Value Date   HGBA1C 6.0 (H) 11/14/2019   HGBA1C 6.2 (H) 08/01/2019   Lab Results  Component Value Date   INSULIN 16.8 08/01/2019   Lab Results  Component Value Date   TSH 3.690 11/14/2019   Lab Results  Component Value Date   CHOL 214 (H) 11/14/2019   HDL 92  11/14/2019   LDLCALC 107 (H) 11/14/2019   TRIG 86 11/14/2019   CHOLHDL 2.3 11/14/2019   Lab Results  Component Value Date   WBC 6.3 11/14/2019   HGB 13.5 11/14/2019   HCT 42.1 11/14/2019   MCV 94 11/14/2019   PLT 234 11/14/2019   Lab Results  Component Value Date   IRON 113 11/14/2019   TIBC 315 11/14/2019   FERRITIN 137 11/14/2019   Attestation Statements:   Reviewed by clinician on day of visit: allergies, medications, problem list, medical history, surgical history, family history, social history, and previous encounter notes.  Fernanda Drum, am acting as Energy manager for [employer], DO   I have reviewed the above documentation for accuracy and completeness, and I agree with the above. Corinna Capra, DO

## 2020-01-12 ENCOUNTER — Encounter (INDEPENDENT_AMBULATORY_CARE_PROVIDER_SITE_OTHER): Payer: Self-pay | Admitting: Bariatrics

## 2020-01-12 DIAGNOSIS — R7303 Prediabetes: Secondary | ICD-10-CM

## 2020-01-16 MED ORDER — METFORMIN HCL 500 MG PO TABS: 500.0000 mg | ORAL_TABLET | Freq: Three times a day (TID) | ORAL | 0 refills | Status: DC

## 2020-01-19 ENCOUNTER — Encounter (INDEPENDENT_AMBULATORY_CARE_PROVIDER_SITE_OTHER): Payer: Self-pay | Admitting: Family Medicine

## 2020-01-23 ENCOUNTER — Ambulatory Visit (INDEPENDENT_AMBULATORY_CARE_PROVIDER_SITE_OTHER): Payer: 59 | Admitting: Family Medicine

## 2020-02-19 ENCOUNTER — Ambulatory Visit (INDEPENDENT_AMBULATORY_CARE_PROVIDER_SITE_OTHER): Payer: 59 | Admitting: Family Medicine

## 2020-02-21 ENCOUNTER — Other Ambulatory Visit (INDEPENDENT_AMBULATORY_CARE_PROVIDER_SITE_OTHER): Payer: Self-pay | Admitting: Family Medicine

## 2020-02-21 DIAGNOSIS — R7303 Prediabetes: Secondary | ICD-10-CM

## 2020-02-21 NOTE — Telephone Encounter (Signed)
Dr.Brown 

## 2020-02-22 ENCOUNTER — Other Ambulatory Visit: Payer: Self-pay | Admitting: Family Medicine

## 2020-02-22 DIAGNOSIS — Z1231 Encounter for screening mammogram for malignant neoplasm of breast: Secondary | ICD-10-CM

## 2020-02-24 IMAGING — MG DIGITAL SCREENING BILATERAL MAMMOGRAM WITH CAD
5 series · 5 of 5 positions shown · non-contrast
Comparison: Previous exam(s).

CLINICAL DATA: Screening.

EXAM:
DIGITAL SCREENING BILATERAL MAMMOGRAM WITH CAD

[L MLO (1 of 2)]
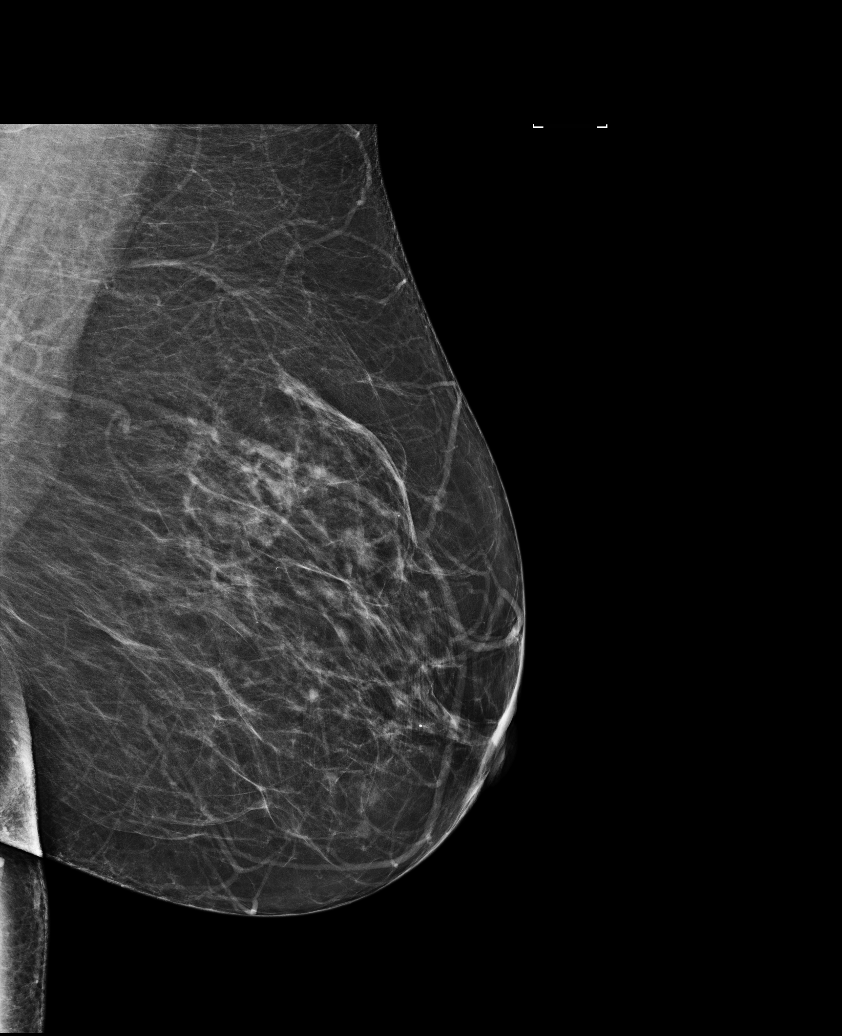

[L CC]
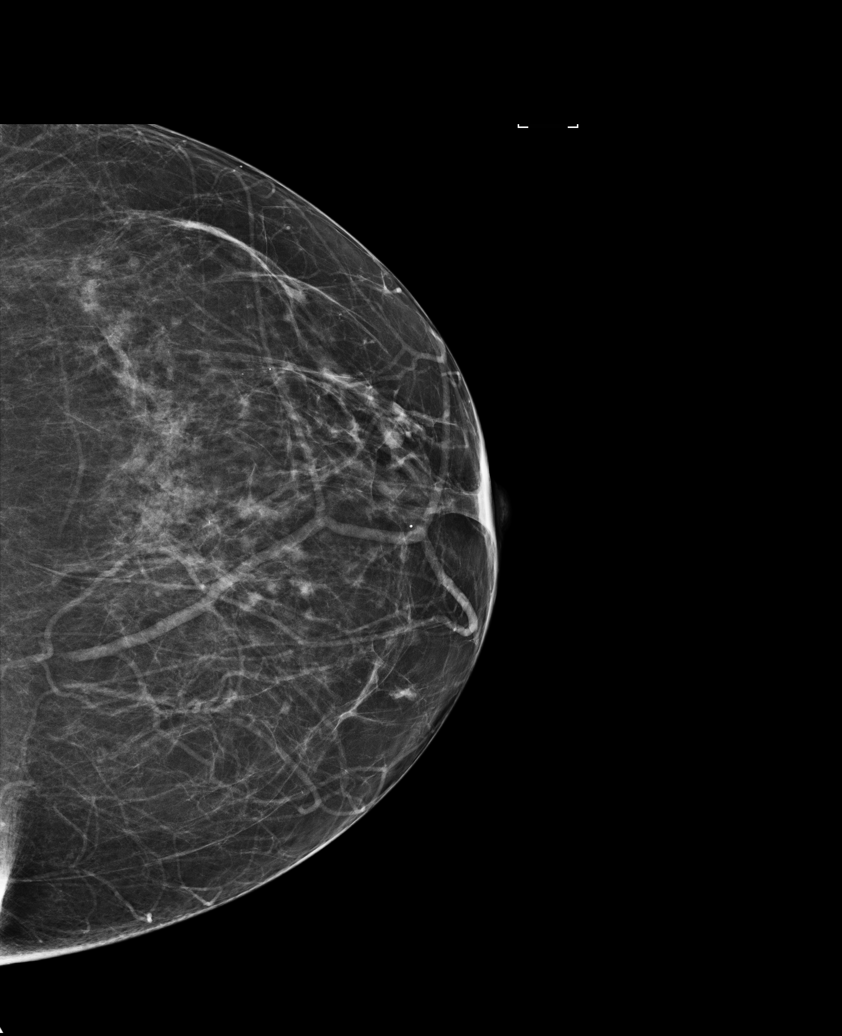

[R MLO]
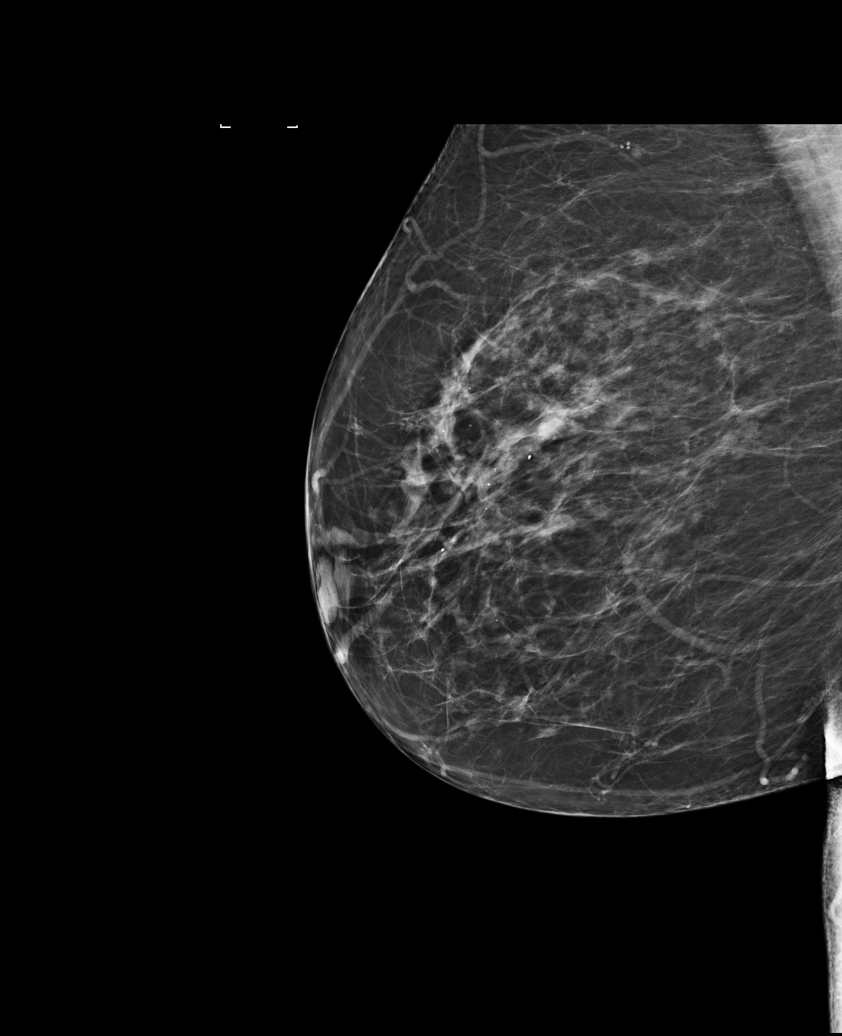

[R CC]
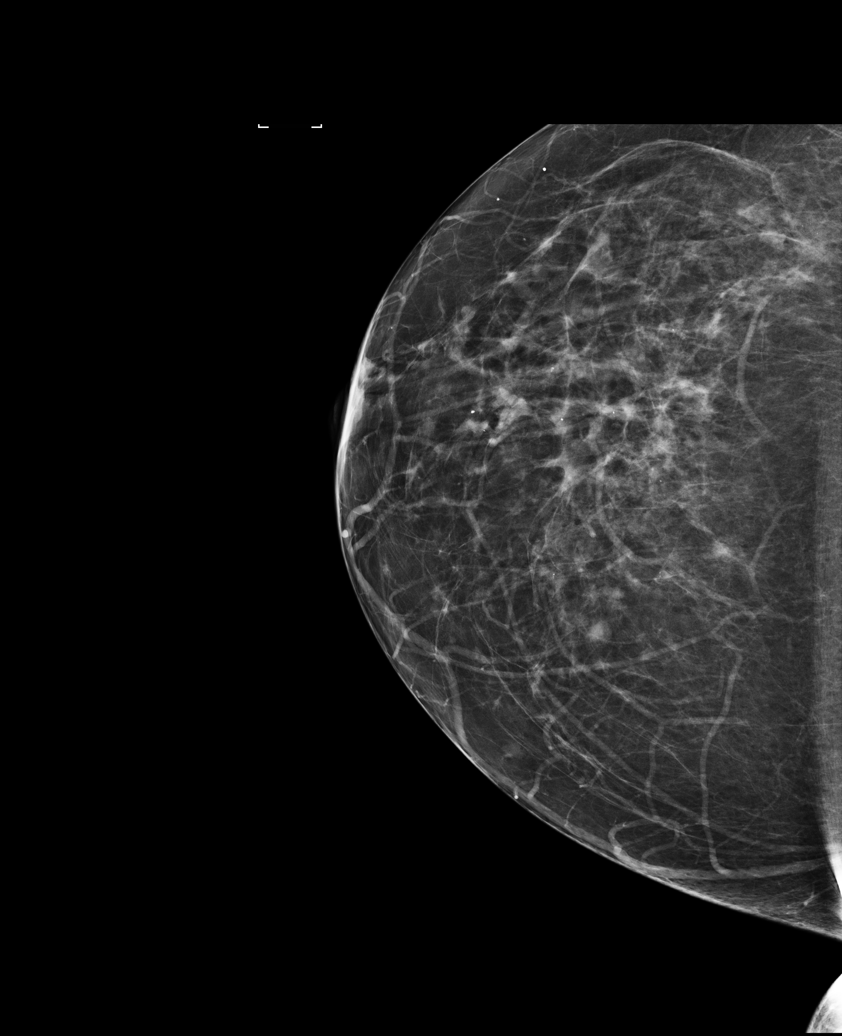

[L MLO (2 of 2)]
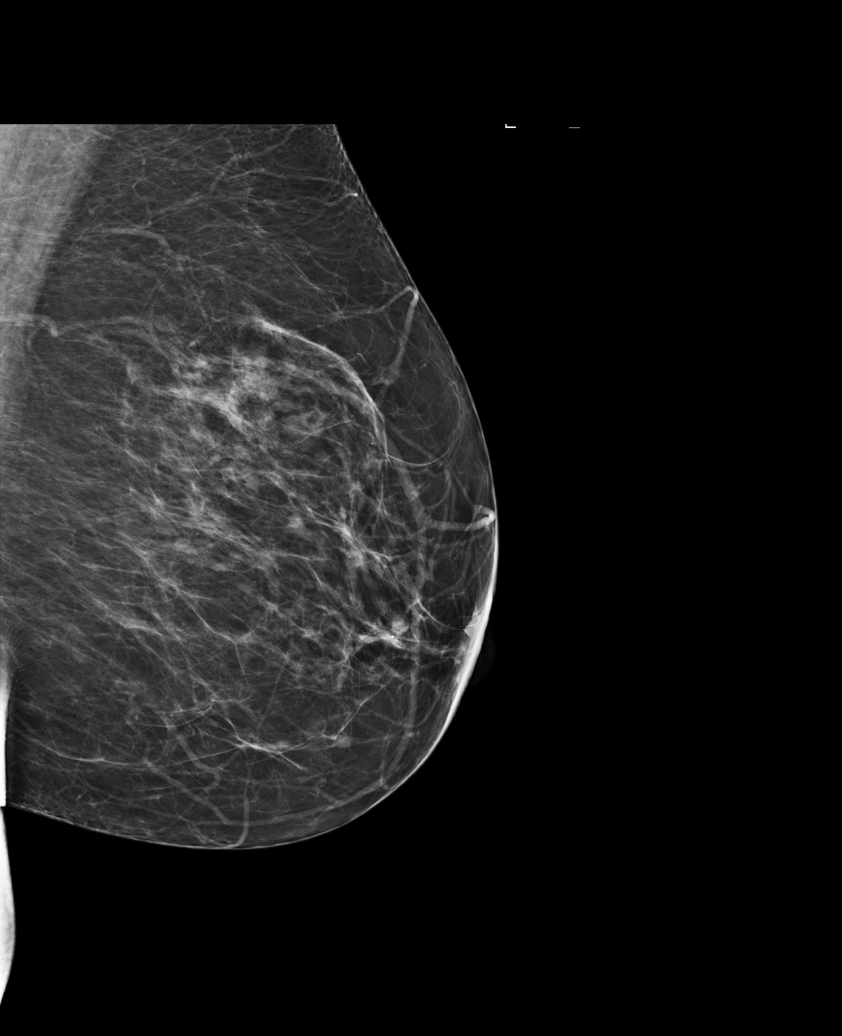

[5 of 5 positions shown; findings below may reference images not displayed]

ACR Breast Density Category b: There are scattered areas of
fibroglandular density.
FINDINGS: In the right breast, a possible asymmetry warrants further
evaluation. This possible asymmetry is seen within the upper RIGHT
breast, at middle depth, MLO view only.

In the left breast, no findings suspicious for malignancy. Images
were processed with CAD.
IMPRESSION: Further evaluation is suggested for possible asymmetry in the right
breast.

RECOMMENDATION:
Diagnostic mammogram and possibly ultrasound of the right breast.
(Code:[id number])

The patient will be contacted regarding the findings, and additional
imaging will be scheduled.

BI-RADS CATEGORY  0: Incomplete. Need additional imaging evaluation
and/or prior mammograms for comparison.

## 2020-07-17 ENCOUNTER — Ambulatory Visit
Admission: RE | Admit: 2020-07-17 | Discharge: 2020-07-17 | Disposition: A | Discharge status: Home / Self Care | Payer: 59 | Source: Ambulatory Visit | Attending: Family Medicine | Admitting: Family Medicine

## 2020-07-17 ENCOUNTER — Other Ambulatory Visit: Payer: Self-pay

## 2020-07-17 DIAGNOSIS — Z1231 Encounter for screening mammogram for malignant neoplasm of breast: Secondary | ICD-10-CM

## 2021-02-17 DIAGNOSIS — Z0000 Encounter for general adult medical examination without abnormal findings: Secondary | ICD-10-CM | POA: Diagnosis not present

## 2021-02-17 DIAGNOSIS — K754 Autoimmune hepatitis: Secondary | ICD-10-CM | POA: Diagnosis not present

## 2021-02-17 DIAGNOSIS — M329 Systemic lupus erythematosus, unspecified: Secondary | ICD-10-CM | POA: Diagnosis not present

## 2021-02-17 DIAGNOSIS — L4050 Arthropathic psoriasis, unspecified: Secondary | ICD-10-CM | POA: Diagnosis not present

## 2021-02-17 DIAGNOSIS — F3342 Major depressive disorder, recurrent, in full remission: Secondary | ICD-10-CM | POA: Diagnosis not present

## 2021-02-24 DIAGNOSIS — F3289 Other specified depressive episodes: Secondary | ICD-10-CM | POA: Diagnosis not present

## 2021-02-24 DIAGNOSIS — F419 Anxiety disorder, unspecified: Secondary | ICD-10-CM | POA: Diagnosis not present

## 2021-03-03 DIAGNOSIS — F3342 Major depressive disorder, recurrent, in full remission: Secondary | ICD-10-CM | POA: Diagnosis not present

## 2021-03-03 DIAGNOSIS — G4700 Insomnia, unspecified: Secondary | ICD-10-CM | POA: Diagnosis not present

## 2021-03-03 DIAGNOSIS — Z13220 Encounter for screening for lipoid disorders: Secondary | ICD-10-CM | POA: Diagnosis not present

## 2021-03-03 DIAGNOSIS — Z0001 Encounter for general adult medical examination with abnormal findings: Secondary | ICD-10-CM | POA: Diagnosis not present

## 2021-03-03 DIAGNOSIS — R635 Abnormal weight gain: Secondary | ICD-10-CM | POA: Diagnosis not present

## 2021-03-03 DIAGNOSIS — K754 Autoimmune hepatitis: Secondary | ICD-10-CM | POA: Diagnosis not present

## 2021-03-03 DIAGNOSIS — L4050 Arthropathic psoriasis, unspecified: Secondary | ICD-10-CM | POA: Diagnosis not present

## 2021-03-03 DIAGNOSIS — M329 Systemic lupus erythematosus, unspecified: Secondary | ICD-10-CM | POA: Diagnosis not present

## 2021-03-03 DIAGNOSIS — I10 Essential (primary) hypertension: Secondary | ICD-10-CM | POA: Diagnosis not present

## 2021-03-03 DIAGNOSIS — Z6841 Body Mass Index (BMI) 40.0 and over, adult: Secondary | ICD-10-CM | POA: Diagnosis not present

## 2021-03-10 DIAGNOSIS — M5136 Other intervertebral disc degeneration, lumbar region: Secondary | ICD-10-CM | POA: Diagnosis not present

## 2021-03-10 DIAGNOSIS — M5030 Other cervical disc degeneration, unspecified cervical region: Secondary | ICD-10-CM | POA: Diagnosis not present

## 2021-03-10 DIAGNOSIS — L4059 Other psoriatic arthropathy: Secondary | ICD-10-CM | POA: Diagnosis not present

## 2021-03-10 DIAGNOSIS — M329 Systemic lupus erythematosus, unspecified: Secondary | ICD-10-CM | POA: Diagnosis not present

## 2021-04-14 DIAGNOSIS — Z79891 Long term (current) use of opiate analgesic: Secondary | ICD-10-CM | POA: Diagnosis not present

## 2021-04-14 DIAGNOSIS — M5459 Other low back pain: Secondary | ICD-10-CM | POA: Diagnosis not present

## 2021-04-14 DIAGNOSIS — M47812 Spondylosis without myelopathy or radiculopathy, cervical region: Secondary | ICD-10-CM | POA: Diagnosis not present

## 2021-04-21 DIAGNOSIS — L538 Other specified erythematous conditions: Secondary | ICD-10-CM | POA: Diagnosis not present

## 2021-04-21 DIAGNOSIS — L728 Other follicular cysts of the skin and subcutaneous tissue: Secondary | ICD-10-CM | POA: Diagnosis not present

## 2021-05-26 DIAGNOSIS — R635 Abnormal weight gain: Secondary | ICD-10-CM | POA: Diagnosis not present

## 2021-05-26 DIAGNOSIS — H608X3 Other otitis externa, bilateral: Secondary | ICD-10-CM | POA: Diagnosis not present

## 2021-05-26 DIAGNOSIS — H6123 Impacted cerumen, bilateral: Secondary | ICD-10-CM | POA: Diagnosis not present

## 2021-06-18 DIAGNOSIS — F3289 Other specified depressive episodes: Secondary | ICD-10-CM | POA: Diagnosis not present

## 2021-06-18 DIAGNOSIS — F419 Anxiety disorder, unspecified: Secondary | ICD-10-CM | POA: Diagnosis not present

## 2021-07-31 DIAGNOSIS — F3289 Other specified depressive episodes: Secondary | ICD-10-CM | POA: Diagnosis not present

## 2021-07-31 DIAGNOSIS — F419 Anxiety disorder, unspecified: Secondary | ICD-10-CM | POA: Diagnosis not present

## 2021-08-08 DIAGNOSIS — Z20822 Contact with and (suspected) exposure to covid-19: Secondary | ICD-10-CM | POA: Diagnosis not present

## 2021-08-08 DIAGNOSIS — Z03818 Encounter for observation for suspected exposure to other biological agents ruled out: Secondary | ICD-10-CM | POA: Diagnosis not present

## 2021-08-08 DIAGNOSIS — Z6841 Body Mass Index (BMI) 40.0 and over, adult: Secondary | ICD-10-CM | POA: Diagnosis not present

## 2021-08-08 DIAGNOSIS — J029 Acute pharyngitis, unspecified: Secondary | ICD-10-CM | POA: Diagnosis not present

## 2021-08-11 DIAGNOSIS — M47812 Spondylosis without myelopathy or radiculopathy, cervical region: Secondary | ICD-10-CM | POA: Diagnosis not present

## 2021-08-11 DIAGNOSIS — G894 Chronic pain syndrome: Secondary | ICD-10-CM | POA: Diagnosis not present

## 2021-08-11 DIAGNOSIS — Z79891 Long term (current) use of opiate analgesic: Secondary | ICD-10-CM | POA: Diagnosis not present

## 2021-08-11 DIAGNOSIS — Z5181 Encounter for therapeutic drug level monitoring: Secondary | ICD-10-CM | POA: Diagnosis not present

## 2021-09-08 DIAGNOSIS — R1084 Generalized abdominal pain: Secondary | ICD-10-CM | POA: Diagnosis not present

## 2021-09-08 DIAGNOSIS — K580 Irritable bowel syndrome with diarrhea: Secondary | ICD-10-CM | POA: Diagnosis not present

## 2021-09-15 DIAGNOSIS — M329 Systemic lupus erythematosus, unspecified: Secondary | ICD-10-CM | POA: Diagnosis not present

## 2021-09-15 DIAGNOSIS — M5030 Other cervical disc degeneration, unspecified cervical region: Secondary | ICD-10-CM | POA: Diagnosis not present

## 2021-09-15 DIAGNOSIS — L4059 Other psoriatic arthropathy: Secondary | ICD-10-CM | POA: Diagnosis not present

## 2021-09-15 DIAGNOSIS — M5136 Other intervertebral disc degeneration, lumbar region: Secondary | ICD-10-CM | POA: Diagnosis not present

## 2021-09-17 ENCOUNTER — Encounter (INDEPENDENT_AMBULATORY_CARE_PROVIDER_SITE_OTHER): Payer: Self-pay

## 2021-09-26 ENCOUNTER — Emergency Department (HOSPITAL_BASED_OUTPATIENT_CLINIC_OR_DEPARTMENT_OTHER)
Admission: EM | Admit: 2021-09-26 | Discharge: 2021-09-26 | Disposition: A | Discharge status: Home / Self Care | Payer: BC Managed Care – PPO | Attending: Emergency Medicine | Admitting: Emergency Medicine

## 2021-09-26 ENCOUNTER — Encounter (HOSPITAL_BASED_OUTPATIENT_CLINIC_OR_DEPARTMENT_OTHER): Payer: Self-pay

## 2021-09-26 ENCOUNTER — Other Ambulatory Visit: Payer: Self-pay

## 2021-09-26 ENCOUNTER — Emergency Department (HOSPITAL_BASED_OUTPATIENT_CLINIC_OR_DEPARTMENT_OTHER): Payer: BC Managed Care – PPO | Admitting: Radiology

## 2021-09-26 DIAGNOSIS — M5442 Lumbago with sciatica, left side: Secondary | ICD-10-CM

## 2021-09-26 DIAGNOSIS — M545 Low back pain, unspecified: Secondary | ICD-10-CM | POA: Diagnosis not present

## 2021-09-26 DIAGNOSIS — I10 Essential (primary) hypertension: Secondary | ICD-10-CM | POA: Diagnosis not present

## 2021-09-26 DIAGNOSIS — M546 Pain in thoracic spine: Secondary | ICD-10-CM | POA: Diagnosis not present

## 2021-09-26 DIAGNOSIS — Z7984 Long term (current) use of oral hypoglycemic drugs: Secondary | ICD-10-CM | POA: Diagnosis not present

## 2021-09-26 DIAGNOSIS — Y92410 Unspecified street and highway as the place of occurrence of the external cause: Secondary | ICD-10-CM | POA: Diagnosis not present

## 2021-09-26 DIAGNOSIS — Z79899 Other long term (current) drug therapy: Secondary | ICD-10-CM | POA: Diagnosis not present

## 2021-09-26 DIAGNOSIS — M47814 Spondylosis without myelopathy or radiculopathy, thoracic region: Secondary | ICD-10-CM | POA: Diagnosis not present

## 2021-09-26 MED ORDER — LIDOCAINE 5 % EX PTCH
1.0000 | MEDICATED_PATCH | CUTANEOUS | Status: DC
  Administered 2021-09-26: 1 via TRANSDERMAL
  Filled 2021-09-26: qty 1

## 2021-09-26 MED ORDER — LIDOCAINE 5 % EX PTCH: 1.0000 | MEDICATED_PATCH | CUTANEOUS | 0 refills | Status: DC
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
## 2021-10-01 DIAGNOSIS — M5459 Other low back pain: Secondary | ICD-10-CM | POA: Diagnosis not present

## 2021-10-01 DIAGNOSIS — G894 Chronic pain syndrome: Secondary | ICD-10-CM | POA: Diagnosis not present

## 2021-10-01 DIAGNOSIS — M5416 Radiculopathy, lumbar region: Secondary | ICD-10-CM | POA: Diagnosis not present

## 2021-10-01 DIAGNOSIS — M546 Pain in thoracic spine: Secondary | ICD-10-CM | POA: Diagnosis not present

## 2021-10-06 DIAGNOSIS — K580 Irritable bowel syndrome with diarrhea: Secondary | ICD-10-CM | POA: Diagnosis not present

## 2021-10-06 DIAGNOSIS — R1013 Epigastric pain: Secondary | ICD-10-CM | POA: Diagnosis not present

## 2021-10-06 DIAGNOSIS — R110 Nausea: Secondary | ICD-10-CM | POA: Diagnosis not present

## 2021-10-15 DIAGNOSIS — K449 Diaphragmatic hernia without obstruction or gangrene: Secondary | ICD-10-CM | POA: Diagnosis not present

## 2021-10-15 DIAGNOSIS — K319 Disease of stomach and duodenum, unspecified: Secondary | ICD-10-CM | POA: Diagnosis not present

## 2021-10-15 DIAGNOSIS — K2970 Gastritis, unspecified, without bleeding: Secondary | ICD-10-CM | POA: Diagnosis not present

## 2021-10-15 DIAGNOSIS — R1013 Epigastric pain: Secondary | ICD-10-CM | POA: Diagnosis not present

## 2021-10-15 DIAGNOSIS — R110 Nausea: Secondary | ICD-10-CM | POA: Diagnosis not present

## 2021-10-27 DIAGNOSIS — M5416 Radiculopathy, lumbar region: Secondary | ICD-10-CM | POA: Diagnosis not present

## 2021-10-27 DIAGNOSIS — G894 Chronic pain syndrome: Secondary | ICD-10-CM | POA: Diagnosis not present

## 2021-11-20 ENCOUNTER — Other Ambulatory Visit: Payer: Self-pay | Admitting: Orthopaedic Surgery

## 2021-11-20 DIAGNOSIS — M4326 Fusion of spine, lumbar region: Secondary | ICD-10-CM

## 2021-11-20 DIAGNOSIS — M791 Myalgia, unspecified site: Secondary | ICD-10-CM | POA: Diagnosis not present

## 2021-11-20 DIAGNOSIS — M4322 Fusion of spine, cervical region: Secondary | ICD-10-CM | POA: Diagnosis not present

## 2021-12-10 DIAGNOSIS — F419 Anxiety disorder, unspecified: Secondary | ICD-10-CM | POA: Diagnosis not present

## 2021-12-10 DIAGNOSIS — F3289 Other specified depressive episodes: Secondary | ICD-10-CM | POA: Diagnosis not present

## 2021-12-12 ENCOUNTER — Ambulatory Visit
Admission: RE | Admit: 2021-12-12 | Discharge: 2021-12-12 | Disposition: A | Discharge status: Home / Self Care | Payer: BC Managed Care – PPO | Source: Ambulatory Visit | Attending: Orthopaedic Surgery | Admitting: Orthopaedic Surgery

## 2021-12-12 DIAGNOSIS — M4326 Fusion of spine, lumbar region: Secondary | ICD-10-CM

## 2021-12-12 DIAGNOSIS — M47816 Spondylosis without myelopathy or radiculopathy, lumbar region: Secondary | ICD-10-CM | POA: Diagnosis not present

## 2021-12-12 DIAGNOSIS — M48061 Spinal stenosis, lumbar region without neurogenic claudication: Secondary | ICD-10-CM | POA: Diagnosis not present

## 2021-12-12 DIAGNOSIS — M5116 Intervertebral disc disorders with radiculopathy, lumbar region: Secondary | ICD-10-CM | POA: Diagnosis not present

## 2021-12-12 MED ORDER — GADOPICLENOL 0.5 MMOL/ML IV SOLN
9.0000 mL | Freq: Once | INTRAVENOUS | Status: AC | PRN
  Administered 2021-12-12: 9 mL via INTRAVENOUS

## 2021-12-24 DIAGNOSIS — M5416 Radiculopathy, lumbar region: Secondary | ICD-10-CM | POA: Diagnosis not present

## 2021-12-24 DIAGNOSIS — M4326 Fusion of spine, lumbar region: Secondary | ICD-10-CM | POA: Diagnosis not present

## 2021-12-24 DIAGNOSIS — M4322 Fusion of spine, cervical region: Secondary | ICD-10-CM | POA: Diagnosis not present

## 2021-12-25 DIAGNOSIS — M4322 Fusion of spine, cervical region: Secondary | ICD-10-CM | POA: Diagnosis not present

## 2021-12-25 DIAGNOSIS — M5416 Radiculopathy, lumbar region: Secondary | ICD-10-CM | POA: Diagnosis not present

## 2021-12-25 DIAGNOSIS — Z6836 Body mass index (BMI) 36.0-36.9, adult: Secondary | ICD-10-CM | POA: Diagnosis not present

## 2021-12-25 DIAGNOSIS — M4326 Fusion of spine, lumbar region: Secondary | ICD-10-CM | POA: Diagnosis not present

## 2022-01-26 DIAGNOSIS — Z79891 Long term (current) use of opiate analgesic: Secondary | ICD-10-CM | POA: Diagnosis not present

## 2022-01-26 DIAGNOSIS — M5416 Radiculopathy, lumbar region: Secondary | ICD-10-CM | POA: Diagnosis not present

## 2022-01-26 DIAGNOSIS — G894 Chronic pain syndrome: Secondary | ICD-10-CM | POA: Diagnosis not present

## 2022-01-26 DIAGNOSIS — M47812 Spondylosis without myelopathy or radiculopathy, cervical region: Secondary | ICD-10-CM | POA: Diagnosis not present

## 2022-02-16 DIAGNOSIS — M5416 Radiculopathy, lumbar region: Secondary | ICD-10-CM | POA: Diagnosis not present

## 2022-03-11 DIAGNOSIS — F3289 Other specified depressive episodes: Secondary | ICD-10-CM | POA: Diagnosis not present

## 2022-03-11 DIAGNOSIS — M4326 Fusion of spine, lumbar region: Secondary | ICD-10-CM | POA: Diagnosis not present

## 2022-03-11 DIAGNOSIS — F419 Anxiety disorder, unspecified: Secondary | ICD-10-CM | POA: Diagnosis not present

## 2022-03-11 DIAGNOSIS — M5416 Radiculopathy, lumbar region: Secondary | ICD-10-CM | POA: Diagnosis not present

## 2022-04-16 DIAGNOSIS — M48062 Spinal stenosis, lumbar region with neurogenic claudication: Secondary | ICD-10-CM | POA: Diagnosis not present

## 2022-04-16 DIAGNOSIS — M5106 Intervertebral disc disorders with myelopathy, lumbar region: Secondary | ICD-10-CM | POA: Diagnosis not present

## 2022-04-16 DIAGNOSIS — M4316 Spondylolisthesis, lumbar region: Secondary | ICD-10-CM | POA: Diagnosis not present

## 2022-04-16 DIAGNOSIS — M5416 Radiculopathy, lumbar region: Secondary | ICD-10-CM | POA: Diagnosis not present

## 2022-06-02 DIAGNOSIS — G894 Chronic pain syndrome: Secondary | ICD-10-CM | POA: Diagnosis not present

## 2022-06-02 DIAGNOSIS — M5416 Radiculopathy, lumbar region: Secondary | ICD-10-CM | POA: Diagnosis not present

## 2022-06-02 DIAGNOSIS — M5459 Other low back pain: Secondary | ICD-10-CM | POA: Diagnosis not present

## 2022-06-02 DIAGNOSIS — Z79891 Long term (current) use of opiate analgesic: Secondary | ICD-10-CM | POA: Diagnosis not present

## 2022-06-04 DIAGNOSIS — G4700 Insomnia, unspecified: Secondary | ICD-10-CM | POA: Diagnosis not present

## 2022-06-04 DIAGNOSIS — Z0001 Encounter for general adult medical examination with abnormal findings: Secondary | ICD-10-CM | POA: Diagnosis not present

## 2022-06-04 DIAGNOSIS — M329 Systemic lupus erythematosus, unspecified: Secondary | ICD-10-CM | POA: Diagnosis not present

## 2022-06-04 DIAGNOSIS — Z87440 Personal history of urinary (tract) infections: Secondary | ICD-10-CM | POA: Diagnosis not present

## 2022-06-04 DIAGNOSIS — N390 Urinary tract infection, site not specified: Secondary | ICD-10-CM | POA: Diagnosis not present

## 2022-06-12 DIAGNOSIS — M5106 Intervertebral disc disorders with myelopathy, lumbar region: Secondary | ICD-10-CM | POA: Diagnosis not present

## 2022-06-12 DIAGNOSIS — M4326 Fusion of spine, lumbar region: Secondary | ICD-10-CM | POA: Diagnosis not present

## 2022-06-12 DIAGNOSIS — M5416 Radiculopathy, lumbar region: Secondary | ICD-10-CM | POA: Diagnosis not present

## 2022-06-12 DIAGNOSIS — M4316 Spondylolisthesis, lumbar region: Secondary | ICD-10-CM | POA: Diagnosis not present

## 2022-06-19 DIAGNOSIS — Z01818 Encounter for other preprocedural examination: Secondary | ICD-10-CM | POA: Diagnosis not present

## 2022-06-19 DIAGNOSIS — M545 Low back pain, unspecified: Secondary | ICD-10-CM | POA: Diagnosis not present

## 2022-06-22 DIAGNOSIS — Z79899 Other long term (current) drug therapy: Secondary | ICD-10-CM | POA: Diagnosis not present

## 2022-06-22 DIAGNOSIS — M4156 Other secondary scoliosis, lumbar region: Secondary | ICD-10-CM | POA: Diagnosis not present

## 2022-06-22 DIAGNOSIS — K754 Autoimmune hepatitis: Secondary | ICD-10-CM | POA: Diagnosis not present

## 2022-06-22 DIAGNOSIS — M4316 Spondylolisthesis, lumbar region: Secondary | ICD-10-CM | POA: Diagnosis not present

## 2022-06-22 DIAGNOSIS — S33140A Subluxation of L4/L5 lumbar vertebra, initial encounter: Secondary | ICD-10-CM | POA: Diagnosis not present

## 2022-06-22 DIAGNOSIS — M5416 Radiculopathy, lumbar region: Secondary | ICD-10-CM | POA: Diagnosis not present

## 2022-06-22 DIAGNOSIS — Z7409 Other reduced mobility: Secondary | ICD-10-CM | POA: Diagnosis not present

## 2022-06-22 DIAGNOSIS — M48062 Spinal stenosis, lumbar region with neurogenic claudication: Secondary | ICD-10-CM | POA: Diagnosis not present

## 2022-06-22 DIAGNOSIS — M532X7 Spinal instabilities, lumbosacral region: Secondary | ICD-10-CM | POA: Diagnosis not present

## 2022-06-22 DIAGNOSIS — M4726 Other spondylosis with radiculopathy, lumbar region: Secondary | ICD-10-CM | POA: Diagnosis not present

## 2022-06-22 DIAGNOSIS — X58XXXA Exposure to other specified factors, initial encounter: Secondary | ICD-10-CM | POA: Diagnosis not present

## 2022-06-22 DIAGNOSIS — I10 Essential (primary) hypertension: Secondary | ICD-10-CM | POA: Diagnosis not present

## 2022-06-22 DIAGNOSIS — M5106 Intervertebral disc disorders with myelopathy, lumbar region: Secondary | ICD-10-CM | POA: Diagnosis not present

## 2022-06-22 DIAGNOSIS — Z981 Arthrodesis status: Secondary | ICD-10-CM | POA: Diagnosis not present

## 2022-06-22 DIAGNOSIS — M329 Systemic lupus erythematosus, unspecified: Secondary | ICD-10-CM | POA: Diagnosis not present

## 2022-06-22 DIAGNOSIS — S33130A Subluxation of L3/L4 lumbar vertebra, initial encounter: Secondary | ICD-10-CM | POA: Diagnosis not present

## 2022-06-23 DIAGNOSIS — I10 Essential (primary) hypertension: Secondary | ICD-10-CM | POA: Diagnosis not present

## 2022-06-23 DIAGNOSIS — S33130A Subluxation of L3/L4 lumbar vertebra, initial encounter: Secondary | ICD-10-CM | POA: Diagnosis not present

## 2022-06-23 DIAGNOSIS — M532X7 Spinal instabilities, lumbosacral region: Secondary | ICD-10-CM | POA: Diagnosis not present

## 2022-06-23 DIAGNOSIS — M48062 Spinal stenosis, lumbar region with neurogenic claudication: Secondary | ICD-10-CM | POA: Diagnosis not present

## 2022-06-23 DIAGNOSIS — M329 Systemic lupus erythematosus, unspecified: Secondary | ICD-10-CM | POA: Diagnosis not present

## 2022-06-23 DIAGNOSIS — M4156 Other secondary scoliosis, lumbar region: Secondary | ICD-10-CM | POA: Diagnosis not present

## 2022-06-23 DIAGNOSIS — Z981 Arthrodesis status: Secondary | ICD-10-CM | POA: Diagnosis not present

## 2022-06-23 DIAGNOSIS — Z79899 Other long term (current) drug therapy: Secondary | ICD-10-CM | POA: Diagnosis not present

## 2022-06-23 DIAGNOSIS — M4726 Other spondylosis with radiculopathy, lumbar region: Secondary | ICD-10-CM | POA: Diagnosis not present

## 2022-06-23 DIAGNOSIS — Z7409 Other reduced mobility: Secondary | ICD-10-CM | POA: Diagnosis not present

## 2022-06-23 DIAGNOSIS — X58XXXA Exposure to other specified factors, initial encounter: Secondary | ICD-10-CM | POA: Diagnosis not present

## 2022-06-23 DIAGNOSIS — S33140A Subluxation of L4/L5 lumbar vertebra, initial encounter: Secondary | ICD-10-CM | POA: Diagnosis not present

## 2022-06-23 DIAGNOSIS — K754 Autoimmune hepatitis: Secondary | ICD-10-CM | POA: Diagnosis not present

## 2022-06-24 DIAGNOSIS — Z7409 Other reduced mobility: Secondary | ICD-10-CM | POA: Diagnosis not present

## 2022-06-24 DIAGNOSIS — M4726 Other spondylosis with radiculopathy, lumbar region: Secondary | ICD-10-CM | POA: Diagnosis not present

## 2022-06-24 DIAGNOSIS — M4156 Other secondary scoliosis, lumbar region: Secondary | ICD-10-CM | POA: Diagnosis not present

## 2022-06-24 DIAGNOSIS — I10 Essential (primary) hypertension: Secondary | ICD-10-CM | POA: Diagnosis not present

## 2022-06-24 DIAGNOSIS — M532X7 Spinal instabilities, lumbosacral region: Secondary | ICD-10-CM | POA: Diagnosis not present

## 2022-06-24 DIAGNOSIS — M329 Systemic lupus erythematosus, unspecified: Secondary | ICD-10-CM | POA: Diagnosis not present

## 2022-06-24 DIAGNOSIS — S33130A Subluxation of L3/L4 lumbar vertebra, initial encounter: Secondary | ICD-10-CM | POA: Diagnosis not present

## 2022-06-24 DIAGNOSIS — K754 Autoimmune hepatitis: Secondary | ICD-10-CM | POA: Diagnosis not present

## 2022-06-24 DIAGNOSIS — Z981 Arthrodesis status: Secondary | ICD-10-CM | POA: Diagnosis not present

## 2022-06-24 DIAGNOSIS — S33140A Subluxation of L4/L5 lumbar vertebra, initial encounter: Secondary | ICD-10-CM | POA: Diagnosis not present

## 2022-06-24 DIAGNOSIS — X58XXXA Exposure to other specified factors, initial encounter: Secondary | ICD-10-CM | POA: Diagnosis not present

## 2022-06-24 DIAGNOSIS — M48062 Spinal stenosis, lumbar region with neurogenic claudication: Secondary | ICD-10-CM | POA: Diagnosis not present

## 2022-06-24 DIAGNOSIS — Z79899 Other long term (current) drug therapy: Secondary | ICD-10-CM | POA: Diagnosis not present

## 2022-06-25 DIAGNOSIS — M48062 Spinal stenosis, lumbar region with neurogenic claudication: Secondary | ICD-10-CM | POA: Diagnosis not present

## 2022-06-25 DIAGNOSIS — X58XXXA Exposure to other specified factors, initial encounter: Secondary | ICD-10-CM | POA: Diagnosis not present

## 2022-06-25 DIAGNOSIS — Z981 Arthrodesis status: Secondary | ICD-10-CM | POA: Diagnosis not present

## 2022-06-25 DIAGNOSIS — S33130A Subluxation of L3/L4 lumbar vertebra, initial encounter: Secondary | ICD-10-CM | POA: Diagnosis not present

## 2022-06-25 DIAGNOSIS — M532X7 Spinal instabilities, lumbosacral region: Secondary | ICD-10-CM | POA: Diagnosis not present

## 2022-06-25 DIAGNOSIS — S33140A Subluxation of L4/L5 lumbar vertebra, initial encounter: Secondary | ICD-10-CM | POA: Diagnosis not present

## 2022-06-25 DIAGNOSIS — M4726 Other spondylosis with radiculopathy, lumbar region: Secondary | ICD-10-CM | POA: Diagnosis not present

## 2022-06-25 DIAGNOSIS — M329 Systemic lupus erythematosus, unspecified: Secondary | ICD-10-CM | POA: Diagnosis not present

## 2022-06-25 DIAGNOSIS — Z79899 Other long term (current) drug therapy: Secondary | ICD-10-CM | POA: Diagnosis not present

## 2022-06-25 DIAGNOSIS — I10 Essential (primary) hypertension: Secondary | ICD-10-CM | POA: Diagnosis not present

## 2022-06-25 DIAGNOSIS — K754 Autoimmune hepatitis: Secondary | ICD-10-CM | POA: Diagnosis not present

## 2022-06-25 DIAGNOSIS — M4156 Other secondary scoliosis, lumbar region: Secondary | ICD-10-CM | POA: Diagnosis not present

## 2022-06-25 DIAGNOSIS — Z7409 Other reduced mobility: Secondary | ICD-10-CM | POA: Diagnosis not present

## 2022-06-28 DIAGNOSIS — M48062 Spinal stenosis, lumbar region with neurogenic claudication: Secondary | ICD-10-CM | POA: Diagnosis not present

## 2022-06-28 DIAGNOSIS — Z9181 History of falling: Secondary | ICD-10-CM | POA: Diagnosis not present

## 2022-06-28 DIAGNOSIS — Z7952 Long term (current) use of systemic steroids: Secondary | ICD-10-CM | POA: Diagnosis not present

## 2022-06-28 DIAGNOSIS — Z792 Long term (current) use of antibiotics: Secondary | ICD-10-CM | POA: Diagnosis not present

## 2022-06-28 DIAGNOSIS — Z981 Arthrodesis status: Secondary | ICD-10-CM | POA: Diagnosis not present

## 2022-06-28 DIAGNOSIS — Z4789 Encounter for other orthopedic aftercare: Secondary | ICD-10-CM | POA: Diagnosis not present

## 2022-06-28 DIAGNOSIS — Z9981 Dependence on supplemental oxygen: Secondary | ICD-10-CM | POA: Diagnosis not present

## 2022-06-29 DIAGNOSIS — Z7952 Long term (current) use of systemic steroids: Secondary | ICD-10-CM | POA: Diagnosis not present

## 2022-06-29 DIAGNOSIS — Z9181 History of falling: Secondary | ICD-10-CM | POA: Diagnosis not present

## 2022-06-29 DIAGNOSIS — Z792 Long term (current) use of antibiotics: Secondary | ICD-10-CM | POA: Diagnosis not present

## 2022-06-29 DIAGNOSIS — Z4789 Encounter for other orthopedic aftercare: Secondary | ICD-10-CM | POA: Diagnosis not present

## 2022-06-29 DIAGNOSIS — Z981 Arthrodesis status: Secondary | ICD-10-CM | POA: Diagnosis not present

## 2022-06-29 DIAGNOSIS — Z9981 Dependence on supplemental oxygen: Secondary | ICD-10-CM | POA: Diagnosis not present

## 2022-06-29 DIAGNOSIS — M48062 Spinal stenosis, lumbar region with neurogenic claudication: Secondary | ICD-10-CM | POA: Diagnosis not present

## 2022-07-01 DIAGNOSIS — Z4789 Encounter for other orthopedic aftercare: Secondary | ICD-10-CM | POA: Diagnosis not present

## 2022-07-01 DIAGNOSIS — Z981 Arthrodesis status: Secondary | ICD-10-CM | POA: Diagnosis not present

## 2022-07-01 DIAGNOSIS — Z9981 Dependence on supplemental oxygen: Secondary | ICD-10-CM | POA: Diagnosis not present

## 2022-07-01 DIAGNOSIS — Z7952 Long term (current) use of systemic steroids: Secondary | ICD-10-CM | POA: Diagnosis not present

## 2022-07-01 DIAGNOSIS — M48062 Spinal stenosis, lumbar region with neurogenic claudication: Secondary | ICD-10-CM | POA: Diagnosis not present

## 2022-07-01 DIAGNOSIS — Z9181 History of falling: Secondary | ICD-10-CM | POA: Diagnosis not present

## 2022-07-01 DIAGNOSIS — Z792 Long term (current) use of antibiotics: Secondary | ICD-10-CM | POA: Diagnosis not present

## 2022-07-03 DIAGNOSIS — Z9181 History of falling: Secondary | ICD-10-CM | POA: Diagnosis not present

## 2022-07-03 DIAGNOSIS — Z792 Long term (current) use of antibiotics: Secondary | ICD-10-CM | POA: Diagnosis not present

## 2022-07-03 DIAGNOSIS — Z7952 Long term (current) use of systemic steroids: Secondary | ICD-10-CM | POA: Diagnosis not present

## 2022-07-03 DIAGNOSIS — Z981 Arthrodesis status: Secondary | ICD-10-CM | POA: Diagnosis not present

## 2022-07-03 DIAGNOSIS — Z4789 Encounter for other orthopedic aftercare: Secondary | ICD-10-CM | POA: Diagnosis not present

## 2022-07-03 DIAGNOSIS — Z9981 Dependence on supplemental oxygen: Secondary | ICD-10-CM | POA: Diagnosis not present

## 2022-07-03 DIAGNOSIS — M48062 Spinal stenosis, lumbar region with neurogenic claudication: Secondary | ICD-10-CM | POA: Diagnosis not present

## 2022-07-07 DIAGNOSIS — M48062 Spinal stenosis, lumbar region with neurogenic claudication: Secondary | ICD-10-CM | POA: Diagnosis not present

## 2022-07-07 DIAGNOSIS — Z4789 Encounter for other orthopedic aftercare: Secondary | ICD-10-CM | POA: Diagnosis not present

## 2022-07-07 DIAGNOSIS — Z7952 Long term (current) use of systemic steroids: Secondary | ICD-10-CM | POA: Diagnosis not present

## 2022-07-07 DIAGNOSIS — Z981 Arthrodesis status: Secondary | ICD-10-CM | POA: Diagnosis not present

## 2022-07-07 DIAGNOSIS — Z9981 Dependence on supplemental oxygen: Secondary | ICD-10-CM | POA: Diagnosis not present

## 2022-07-07 DIAGNOSIS — Z792 Long term (current) use of antibiotics: Secondary | ICD-10-CM | POA: Diagnosis not present

## 2022-07-07 DIAGNOSIS — Z9181 History of falling: Secondary | ICD-10-CM | POA: Diagnosis not present

## 2022-07-09 DIAGNOSIS — M48062 Spinal stenosis, lumbar region with neurogenic claudication: Secondary | ICD-10-CM | POA: Diagnosis not present

## 2022-07-09 DIAGNOSIS — Z792 Long term (current) use of antibiotics: Secondary | ICD-10-CM | POA: Diagnosis not present

## 2022-07-09 DIAGNOSIS — Z981 Arthrodesis status: Secondary | ICD-10-CM | POA: Diagnosis not present

## 2022-07-09 DIAGNOSIS — Z9181 History of falling: Secondary | ICD-10-CM | POA: Diagnosis not present

## 2022-07-09 DIAGNOSIS — Z7952 Long term (current) use of systemic steroids: Secondary | ICD-10-CM | POA: Diagnosis not present

## 2022-07-09 DIAGNOSIS — Z4789 Encounter for other orthopedic aftercare: Secondary | ICD-10-CM | POA: Diagnosis not present

## 2022-07-09 DIAGNOSIS — Z9981 Dependence on supplemental oxygen: Secondary | ICD-10-CM | POA: Diagnosis not present

## 2022-07-13 DIAGNOSIS — Z9181 History of falling: Secondary | ICD-10-CM | POA: Diagnosis not present

## 2022-07-13 DIAGNOSIS — Z4789 Encounter for other orthopedic aftercare: Secondary | ICD-10-CM | POA: Diagnosis not present

## 2022-07-13 DIAGNOSIS — Z7952 Long term (current) use of systemic steroids: Secondary | ICD-10-CM | POA: Diagnosis not present

## 2022-07-13 DIAGNOSIS — Z9981 Dependence on supplemental oxygen: Secondary | ICD-10-CM | POA: Diagnosis not present

## 2022-07-13 DIAGNOSIS — M48062 Spinal stenosis, lumbar region with neurogenic claudication: Secondary | ICD-10-CM | POA: Diagnosis not present

## 2022-07-13 DIAGNOSIS — Z981 Arthrodesis status: Secondary | ICD-10-CM | POA: Diagnosis not present

## 2022-07-13 DIAGNOSIS — Z792 Long term (current) use of antibiotics: Secondary | ICD-10-CM | POA: Diagnosis not present

## 2022-07-15 DIAGNOSIS — M48062 Spinal stenosis, lumbar region with neurogenic claudication: Secondary | ICD-10-CM | POA: Diagnosis not present

## 2022-07-15 DIAGNOSIS — Z4789 Encounter for other orthopedic aftercare: Secondary | ICD-10-CM | POA: Diagnosis not present

## 2022-07-15 DIAGNOSIS — Z792 Long term (current) use of antibiotics: Secondary | ICD-10-CM | POA: Diagnosis not present

## 2022-07-15 DIAGNOSIS — Z981 Arthrodesis status: Secondary | ICD-10-CM | POA: Diagnosis not present

## 2022-07-15 DIAGNOSIS — Z7952 Long term (current) use of systemic steroids: Secondary | ICD-10-CM | POA: Diagnosis not present

## 2022-07-15 DIAGNOSIS — Z9981 Dependence on supplemental oxygen: Secondary | ICD-10-CM | POA: Diagnosis not present

## 2022-07-15 DIAGNOSIS — Z9181 History of falling: Secondary | ICD-10-CM | POA: Diagnosis not present

## 2022-07-17 DIAGNOSIS — Z9981 Dependence on supplemental oxygen: Secondary | ICD-10-CM | POA: Diagnosis not present

## 2022-07-17 DIAGNOSIS — Z792 Long term (current) use of antibiotics: Secondary | ICD-10-CM | POA: Diagnosis not present

## 2022-07-17 DIAGNOSIS — Z4789 Encounter for other orthopedic aftercare: Secondary | ICD-10-CM | POA: Diagnosis not present

## 2022-07-17 DIAGNOSIS — Z9181 History of falling: Secondary | ICD-10-CM | POA: Diagnosis not present

## 2022-07-17 DIAGNOSIS — Z981 Arthrodesis status: Secondary | ICD-10-CM | POA: Diagnosis not present

## 2022-07-17 DIAGNOSIS — Z7952 Long term (current) use of systemic steroids: Secondary | ICD-10-CM | POA: Diagnosis not present

## 2022-07-17 DIAGNOSIS — M48062 Spinal stenosis, lumbar region with neurogenic claudication: Secondary | ICD-10-CM | POA: Diagnosis not present

## 2022-07-21 DIAGNOSIS — Z9181 History of falling: Secondary | ICD-10-CM | POA: Diagnosis not present

## 2022-07-21 DIAGNOSIS — M48062 Spinal stenosis, lumbar region with neurogenic claudication: Secondary | ICD-10-CM | POA: Diagnosis not present

## 2022-07-21 DIAGNOSIS — Z7952 Long term (current) use of systemic steroids: Secondary | ICD-10-CM | POA: Diagnosis not present

## 2022-07-21 DIAGNOSIS — Z4789 Encounter for other orthopedic aftercare: Secondary | ICD-10-CM | POA: Diagnosis not present

## 2022-07-21 DIAGNOSIS — Z981 Arthrodesis status: Secondary | ICD-10-CM | POA: Diagnosis not present

## 2022-07-21 DIAGNOSIS — Z9981 Dependence on supplemental oxygen: Secondary | ICD-10-CM | POA: Diagnosis not present

## 2022-07-21 DIAGNOSIS — Z792 Long term (current) use of antibiotics: Secondary | ICD-10-CM | POA: Diagnosis not present

## 2022-07-23 DIAGNOSIS — Z79891 Long term (current) use of opiate analgesic: Secondary | ICD-10-CM | POA: Diagnosis not present

## 2022-07-23 DIAGNOSIS — M4326 Fusion of spine, lumbar region: Secondary | ICD-10-CM | POA: Diagnosis not present

## 2022-07-23 DIAGNOSIS — G894 Chronic pain syndrome: Secondary | ICD-10-CM | POA: Diagnosis not present

## 2022-07-23 DIAGNOSIS — Z79899 Other long term (current) drug therapy: Secondary | ICD-10-CM | POA: Diagnosis not present

## 2022-07-24 DIAGNOSIS — Z9181 History of falling: Secondary | ICD-10-CM | POA: Diagnosis not present

## 2022-07-24 DIAGNOSIS — Z7952 Long term (current) use of systemic steroids: Secondary | ICD-10-CM | POA: Diagnosis not present

## 2022-07-24 DIAGNOSIS — Z4789 Encounter for other orthopedic aftercare: Secondary | ICD-10-CM | POA: Diagnosis not present

## 2022-07-24 DIAGNOSIS — Z792 Long term (current) use of antibiotics: Secondary | ICD-10-CM | POA: Diagnosis not present

## 2022-07-24 DIAGNOSIS — M48062 Spinal stenosis, lumbar region with neurogenic claudication: Secondary | ICD-10-CM | POA: Diagnosis not present

## 2022-07-24 DIAGNOSIS — Z981 Arthrodesis status: Secondary | ICD-10-CM | POA: Diagnosis not present

## 2022-07-24 DIAGNOSIS — Z9981 Dependence on supplemental oxygen: Secondary | ICD-10-CM | POA: Diagnosis not present

## 2022-07-29 DIAGNOSIS — Z4789 Encounter for other orthopedic aftercare: Secondary | ICD-10-CM | POA: Diagnosis not present

## 2022-07-29 DIAGNOSIS — Z9181 History of falling: Secondary | ICD-10-CM | POA: Diagnosis not present

## 2022-07-29 DIAGNOSIS — Z9981 Dependence on supplemental oxygen: Secondary | ICD-10-CM | POA: Diagnosis not present

## 2022-07-29 DIAGNOSIS — Z792 Long term (current) use of antibiotics: Secondary | ICD-10-CM | POA: Diagnosis not present

## 2022-07-29 DIAGNOSIS — Z7952 Long term (current) use of systemic steroids: Secondary | ICD-10-CM | POA: Diagnosis not present

## 2022-07-29 DIAGNOSIS — M48062 Spinal stenosis, lumbar region with neurogenic claudication: Secondary | ICD-10-CM | POA: Diagnosis not present

## 2022-07-29 DIAGNOSIS — Z981 Arthrodesis status: Secondary | ICD-10-CM | POA: Diagnosis not present

## 2022-08-04 DIAGNOSIS — F419 Anxiety disorder, unspecified: Secondary | ICD-10-CM | POA: Diagnosis not present

## 2022-08-04 DIAGNOSIS — F3289 Other specified depressive episodes: Secondary | ICD-10-CM | POA: Diagnosis not present

## 2022-08-05 DIAGNOSIS — M48062 Spinal stenosis, lumbar region with neurogenic claudication: Secondary | ICD-10-CM | POA: Diagnosis not present

## 2022-08-05 DIAGNOSIS — Z7952 Long term (current) use of systemic steroids: Secondary | ICD-10-CM | POA: Diagnosis not present

## 2022-08-05 DIAGNOSIS — Z981 Arthrodesis status: Secondary | ICD-10-CM | POA: Diagnosis not present

## 2022-08-05 DIAGNOSIS — Z9981 Dependence on supplemental oxygen: Secondary | ICD-10-CM | POA: Diagnosis not present

## 2022-08-05 DIAGNOSIS — Z792 Long term (current) use of antibiotics: Secondary | ICD-10-CM | POA: Diagnosis not present

## 2022-08-05 DIAGNOSIS — Z4789 Encounter for other orthopedic aftercare: Secondary | ICD-10-CM | POA: Diagnosis not present

## 2022-08-05 DIAGNOSIS — Z9181 History of falling: Secondary | ICD-10-CM | POA: Diagnosis not present

## 2022-08-05 DIAGNOSIS — F3289 Other specified depressive episodes: Secondary | ICD-10-CM | POA: Diagnosis not present

## 2022-08-05 DIAGNOSIS — F419 Anxiety disorder, unspecified: Secondary | ICD-10-CM | POA: Diagnosis not present

## 2022-08-10 DIAGNOSIS — Z7952 Long term (current) use of systemic steroids: Secondary | ICD-10-CM | POA: Diagnosis not present

## 2022-08-10 DIAGNOSIS — Z792 Long term (current) use of antibiotics: Secondary | ICD-10-CM | POA: Diagnosis not present

## 2022-08-10 DIAGNOSIS — Z4789 Encounter for other orthopedic aftercare: Secondary | ICD-10-CM | POA: Diagnosis not present

## 2022-08-10 DIAGNOSIS — Z9181 History of falling: Secondary | ICD-10-CM | POA: Diagnosis not present

## 2022-08-10 DIAGNOSIS — M48062 Spinal stenosis, lumbar region with neurogenic claudication: Secondary | ICD-10-CM | POA: Diagnosis not present

## 2022-08-10 DIAGNOSIS — Z9981 Dependence on supplemental oxygen: Secondary | ICD-10-CM | POA: Diagnosis not present

## 2022-08-10 DIAGNOSIS — Z981 Arthrodesis status: Secondary | ICD-10-CM | POA: Diagnosis not present

## 2022-08-11 DIAGNOSIS — F419 Anxiety disorder, unspecified: Secondary | ICD-10-CM | POA: Diagnosis not present

## 2022-08-11 DIAGNOSIS — F3289 Other specified depressive episodes: Secondary | ICD-10-CM | POA: Diagnosis not present

## 2022-08-17 DIAGNOSIS — M48062 Spinal stenosis, lumbar region with neurogenic claudication: Secondary | ICD-10-CM | POA: Diagnosis not present

## 2022-08-17 DIAGNOSIS — Z9181 History of falling: Secondary | ICD-10-CM | POA: Diagnosis not present

## 2022-08-17 DIAGNOSIS — Z7952 Long term (current) use of systemic steroids: Secondary | ICD-10-CM | POA: Diagnosis not present

## 2022-08-17 DIAGNOSIS — Z792 Long term (current) use of antibiotics: Secondary | ICD-10-CM | POA: Diagnosis not present

## 2022-08-17 DIAGNOSIS — Z4789 Encounter for other orthopedic aftercare: Secondary | ICD-10-CM | POA: Diagnosis not present

## 2022-08-17 DIAGNOSIS — Z981 Arthrodesis status: Secondary | ICD-10-CM | POA: Diagnosis not present

## 2022-08-17 DIAGNOSIS — Z9981 Dependence on supplemental oxygen: Secondary | ICD-10-CM | POA: Diagnosis not present

## 2022-08-18 DIAGNOSIS — F3289 Other specified depressive episodes: Secondary | ICD-10-CM | POA: Diagnosis not present

## 2022-08-18 DIAGNOSIS — F419 Anxiety disorder, unspecified: Secondary | ICD-10-CM | POA: Diagnosis not present

## 2022-08-24 DIAGNOSIS — L820 Inflamed seborrheic keratosis: Secondary | ICD-10-CM | POA: Diagnosis not present

## 2022-08-24 DIAGNOSIS — M5030 Other cervical disc degeneration, unspecified cervical region: Secondary | ICD-10-CM | POA: Diagnosis not present

## 2022-08-24 DIAGNOSIS — R208 Other disturbances of skin sensation: Secondary | ICD-10-CM | POA: Diagnosis not present

## 2022-08-24 DIAGNOSIS — D485 Neoplasm of uncertain behavior of skin: Secondary | ICD-10-CM | POA: Diagnosis not present

## 2022-08-24 DIAGNOSIS — M5136 Other intervertebral disc degeneration, lumbar region: Secondary | ICD-10-CM | POA: Diagnosis not present

## 2022-08-24 DIAGNOSIS — C44311 Basal cell carcinoma of skin of nose: Secondary | ICD-10-CM | POA: Diagnosis not present

## 2022-08-24 DIAGNOSIS — M329 Systemic lupus erythematosus, unspecified: Secondary | ICD-10-CM | POA: Diagnosis not present

## 2022-08-24 DIAGNOSIS — L4059 Other psoriatic arthropathy: Secondary | ICD-10-CM | POA: Diagnosis not present

## 2022-08-24 DIAGNOSIS — L538 Other specified erythematous conditions: Secondary | ICD-10-CM | POA: Diagnosis not present

## 2022-08-24 DIAGNOSIS — Z789 Other specified health status: Secondary | ICD-10-CM | POA: Diagnosis not present

## 2022-08-25 DIAGNOSIS — Z79899 Other long term (current) drug therapy: Secondary | ICD-10-CM | POA: Diagnosis not present

## 2022-08-25 DIAGNOSIS — H35371 Puckering of macula, right eye: Secondary | ICD-10-CM | POA: Diagnosis not present

## 2022-08-25 DIAGNOSIS — H5213 Myopia, bilateral: Secondary | ICD-10-CM | POA: Diagnosis not present

## 2022-08-27 DIAGNOSIS — Z5181 Encounter for therapeutic drug level monitoring: Secondary | ICD-10-CM | POA: Diagnosis not present

## 2022-08-27 DIAGNOSIS — Z79899 Other long term (current) drug therapy: Secondary | ICD-10-CM | POA: Diagnosis not present

## 2022-08-27 DIAGNOSIS — G894 Chronic pain syndrome: Secondary | ICD-10-CM | POA: Diagnosis not present

## 2022-08-27 DIAGNOSIS — M5416 Radiculopathy, lumbar region: Secondary | ICD-10-CM | POA: Diagnosis not present

## 2022-08-27 DIAGNOSIS — Z79891 Long term (current) use of opiate analgesic: Secondary | ICD-10-CM | POA: Diagnosis not present

## 2022-09-01 DIAGNOSIS — F3289 Other specified depressive episodes: Secondary | ICD-10-CM | POA: Diagnosis not present

## 2022-09-01 DIAGNOSIS — F419 Anxiety disorder, unspecified: Secondary | ICD-10-CM | POA: Diagnosis not present

## 2022-09-07 DIAGNOSIS — Z1231 Encounter for screening mammogram for malignant neoplasm of breast: Secondary | ICD-10-CM | POA: Diagnosis not present

## 2022-09-16 ENCOUNTER — Ambulatory Visit (INDEPENDENT_AMBULATORY_CARE_PROVIDER_SITE_OTHER): Payer: BC Managed Care – PPO | Admitting: Nurse Practitioner

## 2022-09-16 ENCOUNTER — Encounter: Payer: Self-pay | Admitting: Nurse Practitioner

## 2022-09-16 VITALS — BP 112/68 | HR 89 | Ht 60.63 in | Wt 230.0 lb

## 2022-09-16 DIAGNOSIS — N941 Unspecified dyspareunia: Secondary | ICD-10-CM | POA: Diagnosis not present

## 2022-09-16 DIAGNOSIS — N952 Postmenopausal atrophic vaginitis: Secondary | ICD-10-CM

## 2022-09-16 DIAGNOSIS — Z01419 Encounter for gynecological examination (general) (routine) without abnormal findings: Secondary | ICD-10-CM

## 2022-09-16 DIAGNOSIS — Z780 Asymptomatic menopausal state: Secondary | ICD-10-CM

## 2022-09-16 MED ORDER — ESTRADIOL 0.1 MG/GM VA CREA
1.0000 | TOPICAL_CREAM | Freq: Every day | VAGINAL | 2 refills | Status: DC
Start: 2022-09-16 — End: 2023-09-22

## 2022-09-16 NOTE — Progress Notes (Signed)
[name] April 15, 1960 [id number]   History:  62 y.o. [id number] presents as new patient to establish care. Postmenopausal - no HRT. S/P 2006 hysterectomy. Has not been sexually active in years due to focus on health concerns and pain. Feels it is affecting her relationship and would like to be intimate again. Normal pap history. H/O HTN, autoimmune hepatitis, lupus, HLD.   Gynecologic History No LMP recorded. Patient has had a hysterectomy.   Contraception/Family planning: status post hysterectomy Sexually active: No, due to pain  Health Maintenance Last Pap: 01/21/2011. Results were: Normal Last mammogram: 08/2022. Results were: Additional imaging needed, follow up scheduled in September Last colonoscopy: 2 years ago. Results were: Normal, 5-year recall Last Dexa: Years ago. Normal. Scheduled in September  Past medical history, past surgical history, family history and social history were all reviewed and documented in the EPIC chart. Married. 3 children. 2 grandsons ages 70 and 45, granddaughter due in September.   ROS:  A ROS was performed and pertinent positives and negatives are included.  Exam:  Vitals:   09/16/22 1418  BP: 112/68  Pulse: 89  SpO2: 100%  Weight: 230 lb (104.3 kg)  Height: 5' 0.63" (1.54 m)   Body mass index is 43.99 kg/m.  General appearance:  Normal Thyroid:  Symmetrical, normal in size, without palpable masses or nodularity. Respiratory  Auscultation:  Clear without wheezing or rhonchi Cardiovascular  Auscultation:  Regular rate, without rubs, murmurs or gallops  Edema/varicosities:  Not grossly evident Abdominal  Soft,nontender, without masses, guarding or rebound.  Liver/spleen:  No organomegaly noted  Hernia:  None appreciated  Skin  Inspection:  Grossly normal Breasts: Examined lying and sitting.   Right: Without masses, retractions, nipple discharge or axillary adenopathy.   Left: Without masses, retractions, nipple discharge or axillary  adenopathy. Genitourinary   Inguinal/mons:  Normal without inguinal adenopathy  External genitalia:  Normal appearing vulva with no masses, tenderness, or lesions  BUS/Urethra/Skene's glands:  Normal  Vagina:  Normal appearing with normal color and discharge, no lesions. Atrophic changes  Cervix:  And uterus absent  Adnexa/parametria:     Rt: Normal in size, without masses or tenderness.   Lt: Normal in size, without masses or tenderness.  Anus and perineum: Normal  Digital rectal exam: Deferred  Patient informed chaperone available to be present for breast and pelvic exam. Patient has requested no chaperone to be present. Patient has been advised what will be completed during breast and pelvic exam.   Assessment/Plan:  62 y.o. [id number] to establish care.   Well female exam with routine gynecological exam - Education provided on SBEs, importance of preventative screenings, current guidelines, high calcium diet, regular exercise, and multivitamin daily.  Labs done elsewhere.   Postmenopausal - no HRT. S/P 2006 hysterectomy  Vaginal atrophy - Plan: estradiol (ESTRACE VAGINAL) 0.1 MG/GM vaginal cream twice weekly. Will use nightly x 2 weeks initially. Aware of risk for smal amount of systemic absorption.  Dyspareunia in female - Plan: estradiol (ESTRACE VAGINAL) 0.1 MG/GM vaginal cream twice weekly. Recommend vaginal dilators.   Screening for cervical cancer - Normal Pap history.  No longer screening per guidelines.   Screening for breast cancer - Follow up imaging scheduled in September.  Continue annual screenings.  Normal breast exam today.  Screening for colon cancer - Colonoscopy 2 years ago. Will repeat at 5-year interval per GI's recommendation.   Screening for osteoporosis - Scheduled for DXA in September.   Return in 1 year for annual  or sooner if needed.     Olivia Mackie DNP, 3:19 PM 09/16/2022

## 2022-09-23 DIAGNOSIS — Z6836 Body mass index (BMI) 36.0-36.9, adult: Secondary | ICD-10-CM | POA: Diagnosis not present

## 2022-09-23 DIAGNOSIS — M5416 Radiculopathy, lumbar region: Secondary | ICD-10-CM | POA: Diagnosis not present

## 2022-09-23 DIAGNOSIS — M4326 Fusion of spine, lumbar region: Secondary | ICD-10-CM | POA: Diagnosis not present

## 2022-10-14 DIAGNOSIS — N6341 Unspecified lump in right breast, subareolar: Secondary | ICD-10-CM | POA: Diagnosis not present

## 2022-10-14 DIAGNOSIS — Z780 Asymptomatic menopausal state: Secondary | ICD-10-CM | POA: Diagnosis not present

## 2022-10-14 DIAGNOSIS — R928 Other abnormal and inconclusive findings on diagnostic imaging of breast: Secondary | ICD-10-CM | POA: Diagnosis not present

## 2022-10-14 DIAGNOSIS — E2839 Other primary ovarian failure: Secondary | ICD-10-CM | POA: Diagnosis not present

## 2022-10-19 DIAGNOSIS — C44311 Basal cell carcinoma of skin of nose: Secondary | ICD-10-CM | POA: Diagnosis not present

## 2022-10-21 DIAGNOSIS — M329 Systemic lupus erythematosus, unspecified: Secondary | ICD-10-CM | POA: Diagnosis not present

## 2022-11-03 DIAGNOSIS — F419 Anxiety disorder, unspecified: Secondary | ICD-10-CM | POA: Diagnosis not present

## 2022-11-03 DIAGNOSIS — F3289 Other specified depressive episodes: Secondary | ICD-10-CM | POA: Diagnosis not present

## 2023-01-13 ENCOUNTER — Ambulatory Visit (INDEPENDENT_AMBULATORY_CARE_PROVIDER_SITE_OTHER): Payer: Self-pay

## 2023-09-22 ENCOUNTER — Ambulatory Visit: Payer: BC Managed Care – PPO | Admitting: Nurse Practitioner

## 2023-09-22 ENCOUNTER — Encounter: Payer: Self-pay | Admitting: Nurse Practitioner

## 2023-09-22 VITALS — BP 110/74 | HR 81 | Ht 59.75 in | Wt 226.0 lb

## 2023-09-22 DIAGNOSIS — Z01419 Encounter for gynecological examination (general) (routine) without abnormal findings: Secondary | ICD-10-CM

## 2023-09-22 DIAGNOSIS — Z9189 Other specified personal risk factors, not elsewhere classified: Secondary | ICD-10-CM | POA: Diagnosis not present

## 2023-09-22 DIAGNOSIS — Z780 Asymptomatic menopausal state: Secondary | ICD-10-CM

## 2023-09-22 DIAGNOSIS — M8580 Other specified disorders of bone density and structure, unspecified site: Secondary | ICD-10-CM

## 2023-09-22 NOTE — Progress Notes (Signed)
   [name] Aug 02, 1960 [id number]   History:  63 y.o. [id number] presents for breast and pelvic exam. Postmenopausal - no HRT. S/P 2006 hysterectomy. Was using vaginal estrogen for painful intercourse and dryness but stopped using after back surgery due to difficulty inserting. Will reach out if she decides to restart. Normal pap history. H/O prediabetes, HTN, autoimmune hepatitis, lupus, HLD.   Gynecologic History No LMP recorded. Patient has had a hysterectomy.   Contraception/Family planning: status post hysterectomy Sexually active: No  Health Maintenance Last Pap: 01/21/2011. Results were: Normal Last mammogram: 09/16/2023. Results were: Normal Last colonoscopy: 2021. Results were: Normal, 5-year recall Last Dexa: 10/14/2022. Results were: Osteopenia  Past medical history, past surgical history, family history and social history were all reviewed and documented in the EPIC chart. Married. Retired. 3 children. 3 grandchildren ages 83 and 93, granddaughter age 83.   ROS:  A ROS was performed and pertinent positives and negatives are included.  Exam:  Vitals:   09/22/23 1450  BP: 110/74  Pulse: 81  SpO2: 97%  Weight: 226 lb (102.5 kg)  Height: 4' 11.75 (1.518 m)    Body mass index is 44.51 kg/m.  General appearance:  Normal Thyroid:  Symmetrical, normal in size, without palpable masses or nodularity. Respiratory  Auscultation:  Clear without wheezing or rhonchi Cardiovascular  Auscultation:  Regular rate, without rubs, murmurs or gallops  Edema/varicosities:  Not grossly evident Abdominal  Soft,nontender, without masses, guarding or rebound.  Liver/spleen:  No organomegaly noted  Hernia:  None appreciated  Skin  Inspection:  Grossly normal Breasts: Examined lying and sitting.   Right: Without masses, retractions, nipple discharge or axillary adenopathy.   Left: Without masses, retractions, nipple discharge or axillary adenopathy. Pelvic: External genitalia:  no  lesions              Urethra:  normal appearing urethra with no masses, tenderness or lesions              Bartholins and Skenes: normal                 Vagina: normal appearing vagina with normal color and discharge, no lesions              Cervix: absent Bimanual Exam:  Uterus:  absent              Adnexa: no mass, fullness, tenderness              Rectovaginal: Deferred              Anus:  normal, no lesions  [name], CMA present as chaperone.   Assessment/Plan:  63 y.o. [id number] for breast and pelvic exam.  Encounter for breast and pelvic examination - Education provided on SBEs, importance of preventative screenings, current guidelines, high calcium diet, regular exercise, and multivitamin daily.  Labs done elsewhere.   Postmenopausal - no HRT. S/P 2006 hysterectomy  Osteopenia, unspecified location - 10/2022 DXA. Managed by PCP. Going to the gym 5 days per week, taking Vit D supplement.  Screening for cervical cancer - Normal Pap history.  No longer screening per guidelines.   Screening for breast cancer - Continue annual screenings.  Normal breast exam today.  Screening for colon cancer - Colonoscopy 2021. Will repeat at 5-year interval per GI's recommendation.   Return in about 1 year (around 09/21/2024) for B&P (high risk).    Beverly DELENA Shutter DNP, 3:24 PM 09/22/2023

## 2023-10-06 ENCOUNTER — Other Ambulatory Visit: Payer: Self-pay

## 2023-10-06 ENCOUNTER — Ambulatory Visit (HOSPITAL_BASED_OUTPATIENT_CLINIC_OR_DEPARTMENT_OTHER): Attending: Physical Medicine and Rehabilitation | Admitting: Physical Therapy

## 2023-10-06 DIAGNOSIS — M6281 Muscle weakness (generalized): Secondary | ICD-10-CM | POA: Diagnosis present

## 2023-10-06 DIAGNOSIS — R262 Difficulty in walking, not elsewhere classified: Secondary | ICD-10-CM | POA: Diagnosis present

## 2023-10-06 DIAGNOSIS — M5416 Radiculopathy, lumbar region: Secondary | ICD-10-CM | POA: Insufficient documentation
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
## 2023-10-07 ENCOUNTER — Encounter (HOSPITAL_BASED_OUTPATIENT_CLINIC_OR_DEPARTMENT_OTHER): Payer: Self-pay | Admitting: Physical Therapy

## 2023-10-13 ENCOUNTER — Ambulatory Visit (HOSPITAL_BASED_OUTPATIENT_CLINIC_OR_DEPARTMENT_OTHER): Attending: Physical Medicine and Rehabilitation

## 2023-10-13 ENCOUNTER — Encounter (HOSPITAL_BASED_OUTPATIENT_CLINIC_OR_DEPARTMENT_OTHER): Payer: Self-pay

## 2023-10-13 DIAGNOSIS — M5416 Radiculopathy, lumbar region: Secondary | ICD-10-CM | POA: Diagnosis present

## 2023-10-13 DIAGNOSIS — R262 Difficulty in walking, not elsewhere classified: Secondary | ICD-10-CM | POA: Insufficient documentation

## 2023-10-13 DIAGNOSIS — M6281 Muscle weakness (generalized): Secondary | ICD-10-CM | POA: Insufficient documentation
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
## 2023-10-20 ENCOUNTER — Encounter (HOSPITAL_BASED_OUTPATIENT_CLINIC_OR_DEPARTMENT_OTHER): Payer: Self-pay | Admitting: Physical Therapy

## 2023-10-20 ENCOUNTER — Ambulatory Visit (HOSPITAL_BASED_OUTPATIENT_CLINIC_OR_DEPARTMENT_OTHER): Payer: Self-pay | Admitting: Physical Therapy

## 2023-10-20 DIAGNOSIS — M6281 Muscle weakness (generalized): Secondary | ICD-10-CM

## 2023-10-20 DIAGNOSIS — M5416 Radiculopathy, lumbar region: Secondary | ICD-10-CM | POA: Diagnosis not present

## 2023-10-20 DIAGNOSIS — R262 Difficulty in walking, not elsewhere classified: Secondary | ICD-10-CM
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
## 2023-10-22 ENCOUNTER — Ambulatory Visit (HOSPITAL_BASED_OUTPATIENT_CLINIC_OR_DEPARTMENT_OTHER): Admitting: Physical Therapy

## 2023-10-22 ENCOUNTER — Encounter (HOSPITAL_BASED_OUTPATIENT_CLINIC_OR_DEPARTMENT_OTHER): Payer: Self-pay | Admitting: Physical Therapy

## 2023-10-22 DIAGNOSIS — R262 Difficulty in walking, not elsewhere classified: Secondary | ICD-10-CM

## 2023-10-22 DIAGNOSIS — M5416 Radiculopathy, lumbar region: Secondary | ICD-10-CM

## 2023-10-22 DIAGNOSIS — M6281 Muscle weakness (generalized): Secondary | ICD-10-CM
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
## 2023-10-28 ENCOUNTER — Encounter (HOSPITAL_BASED_OUTPATIENT_CLINIC_OR_DEPARTMENT_OTHER): Payer: Self-pay

## 2023-10-28 ENCOUNTER — Encounter (HOSPITAL_BASED_OUTPATIENT_CLINIC_OR_DEPARTMENT_OTHER): Payer: Self-pay | Admitting: Physical Therapy

## 2023-11-01 ENCOUNTER — Encounter (HOSPITAL_BASED_OUTPATIENT_CLINIC_OR_DEPARTMENT_OTHER): Payer: Self-pay
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
## 2023-11-08 ENCOUNTER — Encounter (HOSPITAL_BASED_OUTPATIENT_CLINIC_OR_DEPARTMENT_OTHER): Payer: Self-pay

## 2023-11-08 ENCOUNTER — Ambulatory Visit (HOSPITAL_BASED_OUTPATIENT_CLINIC_OR_DEPARTMENT_OTHER): Admitting: Physical Therapy

## 2023-11-11 ENCOUNTER — Ambulatory Visit (HOSPITAL_BASED_OUTPATIENT_CLINIC_OR_DEPARTMENT_OTHER): Admitting: Physical Therapy

## 2023-11-16 ENCOUNTER — Encounter (HOSPITAL_BASED_OUTPATIENT_CLINIC_OR_DEPARTMENT_OTHER): Admitting: Physical Therapy

## 2023-11-18 ENCOUNTER — Encounter (HOSPITAL_BASED_OUTPATIENT_CLINIC_OR_DEPARTMENT_OTHER): Admitting: Physical Therapy

## 2023-11-22 ENCOUNTER — Encounter (HOSPITAL_BASED_OUTPATIENT_CLINIC_OR_DEPARTMENT_OTHER): Admitting: Physical Therapy

## 2023-11-25 ENCOUNTER — Encounter (HOSPITAL_BASED_OUTPATIENT_CLINIC_OR_DEPARTMENT_OTHER): Admitting: Physical Therapy

## 2024-03-15 NOTE — Therapy (Incomplete)
 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   Mixed sleep apnea 09/28/2016   Myalgia and myositis    Neuropathy    Nuclear sclerotic cataract of left eye 12/25/2014   Obstructive sleep apnea on CPAP    Psoriatic arthritis (HCC)    PVD (posterior vitreous detachment), right eye 10/25/2015   Radicular pain of left lower extremity    Spinal stenosis of lumbar region with radiculopathy 12/01/2018   Systemic lupus erythematosus (HCC)    Treatment-emergent central sleep apnea 09/28/2016   Past Surgical History:  Procedure Laterality Date   CERVICAL SPINE SURGERY  2007   CESAREAN SECTION  [phone]   DENTAL SURGERY  1986   wisdom tooth   LUMBAR SPINE SURGERY     MANDIBLE SURGERY     PARTIAL HYSTERECTOMY  2006   Patient Active Problem List   Diagnosis Date Noted   Osteoarthritis of spine with radiculopathy, lumbar region 11/28/2018   Disc disease, degenerative, cervical 07/20/2018   Autoimmune hepatitis (HCC) 01/25/2018   Chronic urinary tract infection 01/25/2018   Irritable bowel syndrome with diarrhea 01/25/2018   SLE (systemic lupus erythematosus related syndrome) (HCC) 11/16/2017   Long-term current use of opiate analgesic 03/08/2017   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  Rationale for Evaluation and Treatment: {HABREHAB:27488}  ONSET DATE: ***  SUBJECTIVE:   SUBJECTIVE STATEMENT: PT order on 11/10/23 indicated L knee pain/OA and pain control and strengthening.  Pt received PT in Sept 2025 for lumbar radiculopathy though continued to have L knee pain.    PA note on 9/30:  working out regularly at [employer] and has been working to lose weight so she can consider knee replacement. She reports she has to get her BMI down before they will consider surgery.  Pt received an injection.  Left knee pain is consistent with osteoarthritis. She has been previously seen by Dr. Emmit and he did recommend a knee replacement. Unfortunately at this time she has not met the BMI goals and cannot proceed with surgery.  10/15--significant arthritis  --Referring patient to pain management for discussion of viscosupplementation   Prior: Left Intra-articular knee injection with Synvics-One on 12/24/2023 by Dr. Constantino - 0% alleviation  - Prior: Left genicular nerve block on 02/16/2024 by Dr. Constantino - 80% alleviation   Pt has received ?? Synvisc injections.  Physical Therapy: Patient exercises 5-6x/week. Resistance training, eliptical, and treadmill, as  well as aquatic/swimming exercising. --  1/21//26 note:  completing left genicular nerve block on 02/16/24 by Dr. Constantino --  as well as significant (80%) with left genicular nerve block on 02/16/24, I recommended pursuing left nuclear nerve RFA for chronic left knee pain mediated by osteoarthritis.  Pt had peripheral nerve RF ablation genicular L on 03/15/24   The pain is made worse with prolonged standing, ambulation, movement, and general daily activities. Housework, driving,  sleeping  PERTINENT HISTORY: Revision L2-S1 Laminectomy and fusion with TLIF on 06/22/2022 ; L5-S1 fusion 11/28/18   Arthritis in L knee, hx of MCL tear.  Pt reports she will need surgery. 2 cervical fusions with the last in 2020, C3-C7 fused  Lupus, Lupus cerebritis, psoriatic arthritis, and fibromyalgia   anxiety, autoimmune hepatitis, depression, HTN, and DM Type 2 PAIN:  Are you having pain? {OPRCPAIN:27236}  PRECAUTIONS: {Therapy precautions:24002}  RED FLAGS: {PT Red Flags:29287}   WEIGHT BEARING RESTRICTIONS: {Yes ***/No:24003}  FALLS:  Has patient fallen in last 6 months? {fallsyesno:27318}  LIVING ENVIRONMENT: Lives with: {OPRC lives with:25569::lives with their family} Lives in: {Lives in:25570} Stairs: {opstairs:27293} Has following equipment at home: {Assistive devices:23999}  OCCUPATION: ***  PLOF: {PLOF:24004}  PATIENT GOALS: ***  NEXT MD VISIT: ***  OBJECTIVE:  Note: Objective measures were completed at Evaluation unless otherwise noted.  DIAGNOSTIC FINDINGS:  -Xrays of the left knee taken today, 11/24/23, independently reviewed and interpreted by me show severe loss of joint space at medial compartment, spurring at the patella --IMPRESSION: 1. No acute fracture or traumatic malalignment. 2. No joint effusion. 3. Tricompartmental degenerative changes, which are severe in the medial compartment, mild in the lateral compartment, and mild in the patellofemoral compartment. ---  -X-rays left knee, 2 views done in the office today, 11/09/2023 were reviewed with patient. Near complete loss of medial joint space. No acute findings. ----  Per PA note: X-rays L-spine done in March 2025, compared to 01/01/23 and reviewed with patient. L2 -S1 PSF with fusion, no complications, hardware well positioned, no loosening. Posterior-lateral graft appears to be maturing nicely. Adjacent segments are stable. No new fractures noted.   PATIENT SURVEYS:  {rehab  surveys:24030}  COGNITION: Overall cognitive status: {cognition:24006}     SENSATION: {sensation:27233}  EDEMA:  {edema:24020}  MUSCLE LENGTH: Hamstrings: Right *** deg; Left *** deg Debby test: Right *** deg; Left *** deg  POSTURE: {posture:25561}  PALPATION: ***  LOWER EXTREMITY ROM:  {AROM/PROM:27142} ROM Right eval Left eval  Hip flexion    Hip extension    Hip abduction    Hip adduction    Hip internal rotation    Hip external rotation    Knee flexion    Knee extension    Ankle dorsiflexion    Ankle plantarflexion    Ankle inversion    Ankle eversion     (Blank rows = not tested)  LOWER EXTREMITY MMT:  MMT Right eval Left eval  Hip flexion    Hip extension    Hip abduction    Hip adduction    Hip internal rotation    Hip external rotation    Knee flexion    Knee extension    Ankle dorsiflexion    Ankle plantarflexion    Ankle inversion    Ankle eversion     (Blank rows = not tested)  LOWER EXTREMITY SPECIAL TESTS:  {LEspecialtests:26242}  FUNCTIONAL TESTS:  {Functional tests:24029}  GAIT: Distance walked: *** Assistive device utilized: {Assistive devices:23999} Level of assistance: {Levels of  assistance:24026} Comments: ***                                                                                                                                TREATMENT DATE: ***    PATIENT EDUCATION:  Education details: *** Person educated: {Person educated:25204} Education method: {Education Method:25205} Education comprehension: {Education Comprehension:25206}  HOME EXERCISE PROGRAM: ***  ASSESSMENT:  CLINICAL IMPRESSION: Patient is a *** y.o. *** who was seen today for physical therapy evaluation and treatment for ***.   OBJECTIVE IMPAIRMENTS: {opptimpairments:25111}.   ACTIVITY LIMITATIONS: {activitylimitations:27494}  PARTICIPATION LIMITATIONS: {participationrestrictions:25113}  PERSONAL FACTORS: {Personal factors:25162}  are also affecting patient's functional outcome.   REHAB POTENTIAL: {rehabpotential:25112}  CLINICAL DECISION MAKING: {clinical decision making:25114}  EVALUATION COMPLEXITY: {Evaluation complexity:25115}   GOALS: Goals reviewed with patient? {yes/no:20286}  SHORT TERM GOALS: Target date: *** *** Baseline: Goal status: INITIAL  2.  *** Baseline:  Goal status: INITIAL  3.  *** Baseline:  Goal status: INITIAL  4.  *** Baseline:  Goal status: INITIAL  5.  *** Baseline:  Goal status: INITIAL  6.  *** Baseline:  Goal status: INITIAL  LONG TERM GOALS: Target date: ***  *** Baseline:  Goal status: INITIAL  2.  *** Baseline:  Goal status: INITIAL  3.  *** Baseline:  Goal status: INITIAL  4.  *** Baseline:  Goal status: INITIAL  5.  *** Baseline:  Goal status: INITIAL  6.  *** Baseline:  Goal status: INITIAL   PLAN:  PT FREQUENCY: {rehab frequency:25116}  PT DURATION: {rehab duration:25117}  PLANNED INTERVENTIONS: {rehab planned interventions:25118::97110-Therapeutic exercises,97530- Therapeutic [phone]- Neuromuscular re-education,97535- Self Rjmz,02859- Manual therapy,Patient/Family education}  PLAN FOR NEXT SESSION: PIERRETTE Mose Minerva, PT 03/15/2024, 10:42 PM  "

## 2024-03-15 NOTE — Progress Notes (Signed)
" °  Pain Peripheral Nerve Radiofrequency Ablation Genicular; Left; Fluoroscopy  Performed by: Shelly Charity, MD Authorized by: Sherlean Jenkins New, PA-C   Procedure:  Pain Peripheral Nerve Radiofrequency Ablation  Location:  Genicular  Procedure Laterality:  Left  Procedure Guidance:  Fluoroscopy   [facility]  [facility]  PATIENT NAME: [name]  PATIENT MRN: [id number] DATE OF SURGERY: 03/15/2024  TITLE OF PROCEDURE: Genicular Nerve Radiofrequency Ablation  INDICATION: Knee pain by history and physical exam.  Greater than 80% pain relief was obtained by the diagnostic block of these nerves  SIDE: Left  PREOPERATIVE DIAGNOSES: Knee Osteoarthritis   POSTOPERATIVE DIAGNOSES:  Same  LOCATION: [facility]  SURGEON: Dr. Shelly Charity M.D.   ASSISTANTS: Dr. Rumalda Gear, MD  ANESTHESIA:  Local  DESCRIPTION OF OPERATIVE PROCEDURE: The importance of doing the procedure without sedation was discussed with the patient again as was the possibility of pain during the procedure.  Informed consent was obtained. The patient was brought to the Procedure Room and they positioned themselves to their comfort and optimal positioning for procedure.  Time-out was conducted with all members of the care team.  Standard ASA monitors were placed. Standard prep and drape were performed.  Supine Position.   The areas overlying the superior medial, superior lateral, and inferior medial genicular nerves was identified with Fluoroscopy. Following this, a 100 mm, 20 gauge, curved radiofrequency needle with a 10 mm active tip was directed to the periosteal areas connecting the shaft of the femur the bilateral epicondyles and the shaft of the tibia to the medial epicondyle. Needle location was verified in AP/LAT projections. Motor stimulation at 2 Hz was performed with no evidence of distal muscle contraction at each level.  Prior to lesioning 1 mL of 2% lidocaine   and 2mg  of Dexamethasone  was injected at each level.  The patient then received one 90 second lesioning cycle at 80 degrees centigrade at each level. The probe was then allowed to cool prior to withdrawing.  Then, 1.5ml of 0.25% bupivacaine was then injected as the needle was withdrawn to provide deep and superficial anesthesia.    The surgical site preparation was washed off of the patient. Band-Aids were applied. The patient was brought to the recovery area. The patient did very well and the procedure results were discussed.  Standard discharge instructions were given to the patient.  The patient knows how to contact the clinic should they have any questions or problems.  Our clinic nurse will call the patient for follow-up tomorrow.  Preprocedural pain score was  4/10 Post procedural pain score was 0/10  COMPLICATIONS: None   EBL: minimal  URINE OUTPUT: not monitored   PLAN: follow-up in clinic in 6-8 weeks  "

## 2024-03-16 ENCOUNTER — Ambulatory Visit (HOSPITAL_BASED_OUTPATIENT_CLINIC_OR_DEPARTMENT_OTHER): Admitting: Physical Therapy

## 2024-03-16 ENCOUNTER — Encounter (HOSPITAL_BASED_OUTPATIENT_CLINIC_OR_DEPARTMENT_OTHER): Payer: Self-pay

## 2024-03-23 ENCOUNTER — Ambulatory Visit (HOSPITAL_BASED_OUTPATIENT_CLINIC_OR_DEPARTMENT_OTHER): Admitting: Physical Therapy

## 2024-03-30 ENCOUNTER — Encounter (HOSPITAL_BASED_OUTPATIENT_CLINIC_OR_DEPARTMENT_OTHER): Admitting: Physical Therapy

## 2024-04-06 ENCOUNTER — Encounter (HOSPITAL_BASED_OUTPATIENT_CLINIC_OR_DEPARTMENT_OTHER): Admitting: Physical Therapy

## 2024-09-27 ENCOUNTER — Encounter: Admitting: Nurse Practitioner
